# Patient Record
Sex: Male | Born: 1985 | Race: Black or African American | Hispanic: No | Marital: Single | Smoking: Never smoker
Health system: Southern US, Community
[De-identification: ages and names within clinical notes are randomized; demographics above are authoritative.]

## PROBLEM LIST (undated history)

## (undated) DIAGNOSIS — M419 Scoliosis, unspecified: Secondary | ICD-10-CM

---

## 2013-12-29 ENCOUNTER — Emergency Department (HOSPITAL_COMMUNITY): Payer: BC Managed Care – PPO

## 2013-12-29 ENCOUNTER — Inpatient Hospital Stay (HOSPITAL_COMMUNITY)
Admission: EM | Admit: 2013-12-29 | Discharge: 2014-01-06 | DRG: 956 | Disposition: A | Discharge status: Rehabilitation Facility | Payer: BC Managed Care – PPO | Attending: Surgery | Admitting: Surgery

## 2013-12-29 ENCOUNTER — Encounter (HOSPITAL_COMMUNITY): Payer: Self-pay | Admitting: Radiology

## 2013-12-29 DIAGNOSIS — S36113A Laceration of liver, unspecified degree, initial encounter: Secondary | ICD-10-CM | POA: Diagnosis present

## 2013-12-29 DIAGNOSIS — S7290XA Unspecified fracture of unspecified femur, initial encounter for closed fracture: Secondary | ICD-10-CM

## 2013-12-29 DIAGNOSIS — S27329A Contusion of lung, unspecified, initial encounter: Secondary | ICD-10-CM | POA: Diagnosis present

## 2013-12-29 DIAGNOSIS — I959 Hypotension, unspecified: Secondary | ICD-10-CM | POA: Diagnosis present

## 2013-12-29 DIAGNOSIS — S72413A Displaced unspecified condyle fracture of lower end of unspecified femur, initial encounter for closed fracture: Principal | ICD-10-CM | POA: Diagnosis present

## 2013-12-29 DIAGNOSIS — S83509A Sprain of unspecified cruciate ligament of unspecified knee, initial encounter: Secondary | ICD-10-CM | POA: Diagnosis present

## 2013-12-29 DIAGNOSIS — S0292XA Unspecified fracture of facial bones, initial encounter for closed fracture: Secondary | ICD-10-CM

## 2013-12-29 DIAGNOSIS — S82141A Displaced bicondylar fracture of right tibia, initial encounter for closed fracture: Secondary | ICD-10-CM

## 2013-12-29 DIAGNOSIS — D62 Acute posthemorrhagic anemia: Secondary | ICD-10-CM | POA: Diagnosis not present

## 2013-12-29 DIAGNOSIS — S82109A Unspecified fracture of upper end of unspecified tibia, initial encounter for closed fracture: Secondary | ICD-10-CM | POA: Diagnosis present

## 2013-12-29 DIAGNOSIS — S022XXA Fracture of nasal bones, initial encounter for closed fracture: Secondary | ICD-10-CM | POA: Diagnosis present

## 2013-12-29 DIAGNOSIS — S066X0A Traumatic subarachnoid hemorrhage without loss of consciousness, initial encounter: Secondary | ICD-10-CM

## 2013-12-29 DIAGNOSIS — S82001A Unspecified fracture of right patella, initial encounter for closed fracture: Secondary | ICD-10-CM

## 2013-12-29 DIAGNOSIS — S066X9A Traumatic subarachnoid hemorrhage with loss of consciousness of unspecified duration, initial encounter: Secondary | ICD-10-CM | POA: Diagnosis present

## 2013-12-29 DIAGNOSIS — S069X9A Unspecified intracranial injury with loss of consciousness of unspecified duration, initial encounter: Secondary | ICD-10-CM

## 2013-12-29 DIAGNOSIS — S82009A Unspecified fracture of unspecified patella, initial encounter for closed fracture: Secondary | ICD-10-CM | POA: Diagnosis present

## 2013-12-29 DIAGNOSIS — S83519A Sprain of anterior cruciate ligament of unspecified knee, initial encounter: Secondary | ICD-10-CM

## 2013-12-29 DIAGNOSIS — S72401A Unspecified fracture of lower end of right femur, initial encounter for closed fracture: Secondary | ICD-10-CM

## 2013-12-29 DIAGNOSIS — S53106A Unspecified dislocation of unspecified ulnohumeral joint, initial encounter: Secondary | ICD-10-CM | POA: Diagnosis present

## 2013-12-29 DIAGNOSIS — S069XAA Unspecified intracranial injury with loss of consciousness status unknown, initial encounter: Secondary | ICD-10-CM

## 2013-12-29 DIAGNOSIS — S62309A Unspecified fracture of unspecified metacarpal bone, initial encounter for closed fracture: Secondary | ICD-10-CM | POA: Diagnosis present

## 2013-12-29 DIAGNOSIS — S02109A Fracture of base of skull, unspecified side, initial encounter for closed fracture: Secondary | ICD-10-CM | POA: Diagnosis present

## 2013-12-29 DIAGNOSIS — S066XAA Traumatic subarachnoid hemorrhage with loss of consciousness status unknown, initial encounter: Secondary | ICD-10-CM | POA: Diagnosis present

## 2013-12-29 DIAGNOSIS — S82891A Other fracture of right lower leg, initial encounter for closed fracture: Secondary | ICD-10-CM

## 2013-12-29 DIAGNOSIS — S53105A Unspecified dislocation of left ulnohumeral joint, initial encounter: Secondary | ICD-10-CM

## 2013-12-29 DIAGNOSIS — S72412A Displaced unspecified condyle fracture of lower end of left femur, initial encounter for closed fracture: Secondary | ICD-10-CM

## 2013-12-29 LAB — COMPREHENSIVE METABOLIC PANEL
ALK PHOS: 70 U/L (ref 39–117)
ALT: 172 U/L — ABNORMAL HIGH (ref 0–53)
AST: 251 U/L — AB (ref 0–37)
Albumin: 4 g/dL (ref 3.5–5.2)
BILIRUBIN TOTAL: 0.4 mg/dL (ref 0.3–1.2)
BUN: 12 mg/dL (ref 6–23)
CHLORIDE: 99 meq/L (ref 96–112)
CO2: 22 meq/L (ref 19–32)
Calcium: 8.7 mg/dL (ref 8.4–10.5)
Creatinine, Ser: 1.11 mg/dL (ref 0.50–1.35)
GFR calc Af Amer: >90 mL/min (ref >90)
GFR calc non Af Amer: 90 mL/min — ABNORMAL LOW (ref >90)
Glucose, Bld: 154 mg/dL — ABNORMAL HIGH (ref 70–99)
Potassium: 3.6 mEq/L — ABNORMAL LOW (ref 3.7–5.3)
Sodium: 138 mEq/L (ref 137–147)
Total Protein: 7 g/dL (ref 6.0–8.3)

## 2013-12-29 LAB — CBC
HCT: 40.1 % (ref 39.0–52.0)
Hemoglobin: 13.9 g/dL (ref 13.0–17.0)
MCH: 29.3 pg (ref 26.0–34.0)
MCHC: 34.7 g/dL (ref 30.0–36.0)
MCV: 84.4 fL (ref 78.0–100.0)
PLATELETS: 283 10*3/uL (ref 150–400)
RBC: 4.75 MIL/uL (ref 4.22–5.81)
RDW: 12.8 % (ref 11.5–15.5)
WBC: 15.8 10*3/uL — ABNORMAL HIGH (ref 4.0–10.5)

## 2013-12-29 LAB — PREPARE FRESH FROZEN PLASMA
UNIT DIVISION: 0
Unit division: 0

## 2013-12-29 LAB — I-STAT CHEM 8, ED
BUN: 11 mg/dL (ref 6–23)
CALCIUM ION: 1.13 mmol/L (ref 1.12–1.23)
Chloride: 101 mEq/L (ref 96–112)
Creatinine, Ser: 1.2 mg/dL (ref 0.50–1.35)
Glucose, Bld: 149 mg/dL — ABNORMAL HIGH (ref 70–99)
HCT: 43 % (ref 39.0–52.0)
HEMOGLOBIN: 14.6 g/dL (ref 13.0–17.0)
Potassium: 3.2 mEq/L — ABNORMAL LOW (ref 3.7–5.3)
Sodium: 141 mEq/L (ref 137–147)
TCO2: 23 mmol/L (ref 0–100)

## 2013-12-29 LAB — ABO/RH: ABO/RH(D): O POS

## 2013-12-29 LAB — PROTIME-INR
INR: 1.07 (ref 0.00–1.49)
Prothrombin Time: 13.7 seconds (ref 11.6–15.2)

## 2013-12-29 LAB — ETHANOL

## 2013-12-29 LAB — I-STAT CG4 LACTIC ACID, ED: Lactic Acid, Venous: 2.82 mmol/L — ABNORMAL HIGH (ref 0.5–2.2)

## 2013-12-29 LAB — CDS SEROLOGY

## 2013-12-29 MED ORDER — FENTANYL CITRATE 0.05 MG/ML IJ SOLN
50.0000 ug | Freq: Once | INTRAMUSCULAR | Status: AC
  Administered 2013-12-29: 50 ug via INTRAVENOUS
  Filled 2013-12-29: qty 2

## 2013-12-29 MED ORDER — ETOMIDATE 2 MG/ML IV SOLN
INTRAVENOUS | Status: AC
  Administered 2013-12-29: 5 mg via INTRAVENOUS
  Filled 2013-12-29: qty 10

## 2013-12-29 MED ORDER — IOHEXOL 300 MG/ML  SOLN
100.0000 mL | Freq: Once | INTRAMUSCULAR | Status: AC | PRN
  Administered 2013-12-29: 100 mL via INTRAVENOUS

## 2013-12-29 MED ORDER — ETOMIDATE 2 MG/ML IV SOLN
5.0000 mg | Freq: Once | INTRAVENOUS | Status: AC
Start: 2013-12-29 — End: 2013-12-29
  Administered 2013-12-29: 5 mg via INTRAVENOUS

## 2013-12-29 MED ORDER — HYDROMORPHONE HCL PF 1 MG/ML IJ SOLN: 1.0000 mg | Freq: Once | INTRAMUSCULAR | Status: DC

## 2013-12-29 MED ORDER — ONDANSETRON HCL 4 MG/2ML IJ SOLN
4.0000 mg | Freq: Once | INTRAMUSCULAR | Status: AC
  Administered 2013-12-29: 4 mg via INTRAVENOUS
  Filled 2013-12-29: qty 2

## 2013-12-29 NOTE — H&P (Signed)
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 maxillary sinus. The remaining paranasal sinuses and mastoid air cells are well-aerated. Soft tissue swelling is noted overlying the right maxilla, with associated soft tissue laceration along the right cheek.  CT MAXILLOFACIAL FINDINGS  There is a minimally displaced fracture involving both sides of the nasal bone. There is also a small comminuted fracture involving the anterior nasal spine, with associated soft tissue swelling and scattered soft tissue air.  The mandible appears intact. A large dental caries is noted at the right third maxillary molar.  The orbits are intact bilaterally. A small amount of blood is noted within the right maxillary sinus; this appears to reflect disruption of the medial wall of the maxillary sinus. The remaining visualized paranasal sinuses and mastoid air cells are well-aerated.  Soft tissue swelling is noted along the right maxilla, with associated soft tissue laceration along the right cheek. The parapharyngeal fat planes are preserved. The nasopharynx, oropharynx and hypopharynx are unremarkable in appearance. The visualized portions of the valleculae and piriform sinuses are grossly unremarkable.  The parotid and submandibular glands are  within normal limits. No cervical lymphadenopathy is seen.  CT CERVICAL SPINE FINDINGS  There is no evidence of fracture or subluxation. Mild reversal of the normal lordotic curvature of the cervical spine is thought to be positional in nature. Vertebral bodies demonstrate normal height and alignment. Intervertebral disc spaces are preserved. Prevertebral soft tissues are within normal limits. The visualized neural foramina are grossly unremarkable.  The thyroid gland is unremarkable in appearance. The visualized lung apices are clear. No significant soft tissue abnormalities are seen.  IMPRESSION: 1. Small amount of acute subarachnoid hemorrhage noted on the right side near the vertex, with partial effacement of the underlying sulci. 2. No additional evidence for traumatic intracranial injury. 3. Minimally displaced fracture involving both sides of the nasal bone. 4. Small comminuted fracture involving the anterior nasal spine, with associated soft swelling and scattered soft tissue air. 5. Soft tissue swelling overlying the right maxilla, with associated soft tissue laceration along the right cheek. 6. Small amount of blood within the right maxillary sinus appears to reflect disruption of the medial wall of the right maxillary sinus. 7. Large dental caries noted at the right third maxillary molar. 8. No evidence of fracture or subluxation along the cervical spine.  Critical Value/emergent results were called by telephone at the time of interpretation on 12/29/2013 at 10:52 PM to Dr. Brantley Stage, who verbally acknowledged these results.   Electronically Signed   By: Garald Balding M.D.   On: 12/29/2013 23:02    Review of Systems  Unable to perform ROS   Blood pressure 97/51, pulse 73, temperature 98 F (36.7 C), temperature source Oral, resp. rate 17, height _0  (1.727 m), weight 150 lb (68.04 kg), SpO2 100.00%. Physical Exam  Constitutional: He is oriented to person, place, and time. He appears  well-developed. No distress.  HENT:  Head: Head is with abrasion, with contusion and with laceration.    Nose: Sinus tenderness present.  Cardiovascular: Normal rate and regular rhythm.   Respiratory: Effort normal and breath sounds normal. He exhibits tenderness.  GI: Soft. There is tenderness. There is no rigidity and no guarding.    Genitourinary: Rectum normal and penis normal.  Musculoskeletal:       Left elbow: He exhibits deformity.  Right knee: He exhibits deformity. Tenderness found.  Neurological: He is alert and oriented to person, place, and time. GCS eye subscore is 4. GCS verbal subscore is 5. GCS motor subscore is 6.  Skin: Skin is warm and dry.  Psychiatric: He has a normal mood and affect. His behavior is normal. Judgment and thought content normal.     Assessment/Plan [facility] SMALL SAH   DR Arnoldo Morale LEFT ELBOW DISLOCATION/ RIGHT DISTAL FEMUR FRACTURE /POSSIBLE TORN ACL PER ORTHO DUDA Small liver laceration  Observe follow H/H Right pulmonary contusion  NASAL FRACTURE NO ACUTE TREATMENT TONIGHT SMALL FACIAL LACERATION TOO SMALL TO CLOSE LOCAL WOUND CARE.  ADMIT TRAUMA /ICU  KEEP IN C COLLAR IN AM TO CLEAR     CORNETT,THOMAS A. 12/29/2013, 11:21 PM   Procedures

## 2013-12-29 NOTE — Progress Notes (Signed)
Orthopedic Tech Progress Note Patient Details:  Alexander CuriaSinclair Liou 07/20/1985 [id number]  Ortho Devices Type of Ortho Device: Sling immobilizer Ortho Device/Splint Location: sling applied to the left upper extremity.  Ortho Device/Splint Interventions: Application   Early CharsBaker,William Anthony 12/29/2013, 11:46 PM
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
## 2013-12-29 NOTE — ED Notes (Signed)
Back to Trauma Room B, no changes.
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
## 2013-12-29 NOTE — ED Notes (Signed)
All belongings (clothes, boots, wallet, $20 bill, protective vest) given to Barbara CowerJason (brother), clothes cut PTA. [place] 9mm given to [employer].

## 2013-12-29 NOTE — ED Notes (Signed)
[facility]

## 2013-12-29 NOTE — ED Notes (Signed)
[facility], warm blankets given, VSS, alert, NAD, calm, interactive, to CT.
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
## 2013-12-29 NOTE — ED Notes (Addendum)
Head, neck, chest, abd & pelvis complete, VSS, no changes, preparing to scan R knee.
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
## 2013-12-29 NOTE — ED Notes (Signed)
BS FAST + for "some fluid".

## 2013-12-29 NOTE — ED Notes (Signed)
CT finished, no changes.

## 2013-12-29 NOTE — ED Notes (Signed)
[facility]

## 2013-12-29 NOTE — ED Notes (Signed)
[facility] contact information left for pt.

## 2013-12-29 NOTE — ED Notes (Signed)
CG-4 results reported to Dr. Rubin PayorPickering
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
## 2013-12-29 NOTE — ED Notes (Signed)
Into CT 3

## 2013-12-29 NOTE — ED Notes (Signed)
[facility], pt arousable and cooperative, NAD, calm, resting. Thayer OhmChris [facility]

## 2013-12-29 NOTE — ED Notes (Signed)
[facility] Secretary/administratorChris at [facility] Updated. pending admission orders & inpt bed assignment. Pt sedated. Sleeping. arousable to voice.remains on O2 [place] since etomidate sedation. No changes, VSS.
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
## 2013-12-29 NOTE — ED Notes (Signed)
Scanning begins. No changes, VSS, alert, NAD, calm, 103/62, HR 72, SPO2 100% RA.

## 2013-12-30 ENCOUNTER — Encounter (HOSPITAL_COMMUNITY): Admission: EM | Disposition: A | Discharge status: Rehabilitation Facility | Payer: Self-pay | Source: Home / Self Care

## 2013-12-30 ENCOUNTER — Inpatient Hospital Stay (HOSPITAL_COMMUNITY): Payer: BC Managed Care – PPO

## 2013-12-30 DIAGNOSIS — S02109A Fracture of base of skull, unspecified side, initial encounter for closed fracture: Secondary | ICD-10-CM

## 2013-12-30 LAB — COMPREHENSIVE METABOLIC PANEL
ALBUMIN: 3 g/dL — AB (ref 3.5–5.2)
ALK PHOS: 54 U/L (ref 39–117)
ALT: 144 U/L — ABNORMAL HIGH (ref 0–53)
AST: 176 U/L — ABNORMAL HIGH (ref 0–37)
BILIRUBIN TOTAL: 0.5 mg/dL (ref 0.3–1.2)
BUN: 12 mg/dL (ref 6–23)
CHLORIDE: 109 meq/L (ref 96–112)
CO2: 22 meq/L (ref 19–32)
CREATININE: 0.95 mg/dL (ref 0.50–1.35)
Calcium: 7.3 mg/dL — ABNORMAL LOW (ref 8.4–10.5)
GFR calc Af Amer: >90 mL/min (ref >90)
GLUCOSE: 112 mg/dL — AB (ref 70–99)
Potassium: 3.9 mEq/L (ref 3.7–5.3)
Sodium: 142 mEq/L (ref 137–147)
Total Protein: 5.1 g/dL — ABNORMAL LOW (ref 6.0–8.3)

## 2013-12-30 LAB — CBC
HCT: 31.1 % — ABNORMAL LOW (ref 39.0–52.0)
HCT: 27.1 % — ABNORMAL LOW (ref 39.0–52.0)
HEMOGLOBIN: 9.2 g/dL — AB (ref 13.0–17.0)
Hemoglobin: 10.6 g/dL — ABNORMAL LOW (ref 13.0–17.0)
MCH: 29.4 pg (ref 26.0–34.0)
MCH: 28.8 pg (ref 26.0–34.0)
MCHC: 34.1 g/dL (ref 30.0–36.0)
MCHC: 33.9 g/dL (ref 30.0–36.0)
MCV: 86.4 fL (ref 78.0–100.0)
MCV: 84.7 fL (ref 78.0–100.0)
Platelets: 171 10*3/uL (ref 150–400)
Platelets: 178 10*3/uL (ref 150–400)
RBC: 3.6 MIL/uL — ABNORMAL LOW (ref 4.22–5.81)
RBC: 3.2 MIL/uL — AB (ref 4.22–5.81)
RDW: 13 % (ref 11.5–15.5)
RDW: 13 % (ref 11.5–15.5)
WBC: 11.7 10*3/uL — ABNORMAL HIGH (ref 4.0–10.5)
WBC: 8.3 10*3/uL (ref 4.0–10.5)

## 2013-12-30 LAB — TYPE AND SCREEN
ABO/RH(D): O POS
Antibody Screen: NEGATIVE
UNIT DIVISION: 0
Unit division: 0

## 2013-12-30 LAB — MRSA PCR SCREENING: MRSA BY PCR: NEGATIVE

## 2013-12-30 SURGERY — OPEN REDUCTION INTERNAL FIXATION (ORIF) DISTAL FEMUR FRACTURE
Anesthesia: General | Laterality: Right

## 2013-12-30 MED ORDER — NALOXONE HCL 0.4 MG/ML IJ SOLN: 0.4000 mg | INTRAMUSCULAR | Status: DC | PRN

## 2013-12-30 MED ORDER — DIPHENHYDRAMINE HCL 50 MG/ML IJ SOLN: 12.5000 mg | Freq: Four times a day (QID) | INTRAMUSCULAR | Status: DC | PRN

## 2013-12-30 MED ORDER — ONDANSETRON HCL 4 MG/2ML IJ SOLN: 4.0000 mg | Freq: Four times a day (QID) | INTRAMUSCULAR | Status: DC | PRN

## 2013-12-30 MED ORDER — ALBUMIN HUMAN 5 % IV SOLN
12.5000 g | Freq: Once | INTRAVENOUS | Status: AC
  Administered 2013-12-30: 12.5 g via INTRAVENOUS
  Filled 2013-12-30: qty 250

## 2013-12-30 MED ORDER — KCL IN DEXTROSE-NACL 20-5-0.9 MEQ/L-%-% IV SOLN
INTRAVENOUS | Status: DC
  Administered 2013-12-30 – 2013-12-31 (×4): via INTRAVENOUS
  Filled 2013-12-30 (×9): qty 1000

## 2013-12-30 MED ORDER — HYDROMORPHONE 0.3 MG/ML IV SOLN
INTRAVENOUS | Status: DC
  Administered 2013-12-30: 02:00:00 via INTRAVENOUS
  Filled 2013-12-30: qty 25

## 2013-12-30 MED ORDER — HYDROMORPHONE 0.3 MG/ML IV SOLN
INTRAVENOUS | Status: DC
Start: 2013-12-30 — End: 2013-12-30

## 2013-12-30 MED ORDER — DIPHENHYDRAMINE HCL 12.5 MG/5ML PO ELIX
12.5000 mg | ORAL_SOLUTION | Freq: Four times a day (QID) | ORAL | Status: DC | PRN
  Filled 2013-12-30: qty 5

## 2013-12-30 MED ORDER — HYDROMORPHONE HCL PF 1 MG/ML IJ SOLN
0.5000 mg | INTRAMUSCULAR | Status: DC | PRN
  Administered 2013-12-30 – 2013-12-31 (×5): 1 mg via INTRAVENOUS
  Administered 2013-12-31: 0.5 mg via INTRAVENOUS
  Administered 2013-12-31: 1 mg via INTRAVENOUS
  Administered 2014-01-01: 0.5 mg via INTRAVENOUS
  Filled 2013-12-30 (×8): qty 1

## 2013-12-30 MED ORDER — CLINDAMYCIN PHOSPHATE 900 MG/50ML IV SOLN
900.0000 mg | INTRAVENOUS | Status: AC
Start: 2013-12-30 — End: 2013-12-31
  Filled 2013-12-30: qty 50

## 2013-12-30 MED ORDER — WHITE PETROLATUM GEL
Status: AC
  Filled 2013-12-30: qty 5

## 2013-12-30 MED ORDER — OXYCODONE HCL 5 MG PO TABS
5.0000 mg | ORAL_TABLET | ORAL | Status: DC | PRN
  Administered 2013-12-31: 10 mg via ORAL
  Administered 2013-12-31 (×2): 5 mg via ORAL
  Administered 2014-01-01 – 2014-01-02 (×5): 10 mg via ORAL
  Filled 2013-12-30: qty 1
  Filled 2013-12-30 (×2): qty 2
  Filled 2013-12-30: qty 1
  Filled 2013-12-30 (×4): qty 2

## 2013-12-30 MED ORDER — CHLORHEXIDINE GLUCONATE 4 % EX LIQD
60.0000 mL | Freq: Once | CUTANEOUS | Status: DC
  Filled 2013-12-30: qty 60

## 2013-12-30 MED ORDER — SODIUM CHLORIDE 0.9 % IJ SOLN: 9.0000 mL | INTRAMUSCULAR | Status: DC | PRN

## 2013-12-30 MED ORDER — ONDANSETRON HCL 4 MG PO TABS: 4.0000 mg | ORAL_TABLET | Freq: Four times a day (QID) | ORAL | Status: DC | PRN

## 2013-12-30 MED ORDER — BIOTENE DRY MOUTH MT LIQD
15.0000 mL | Freq: Two times a day (BID) | OROMUCOSAL | Status: DC
  Administered 2013-12-30 (×2): 15 mL via OROMUCOSAL

## 2013-12-30 MED ORDER — ONDANSETRON HCL 4 MG/2ML IJ SOLN
4.0000 mg | Freq: Four times a day (QID) | INTRAMUSCULAR | Status: DC | PRN
  Filled 2013-12-30: qty 2

## 2013-12-30 MED ORDER — ONDANSETRON HCL 4 MG/2ML IJ SOLN
4.0000 mg | Freq: Four times a day (QID) | INTRAMUSCULAR | Status: DC | PRN
  Administered 2013-12-30: 4 mg via INTRAVENOUS

## 2013-12-30 SURGICAL SUPPLY — 49 items
BANDAGE GAUZE ELAST BULKY 4 IN (GAUZE/BANDAGES/DRESSINGS) ×3 IMPLANT
BLADE SURG 10 STRL SS (BLADE) ×3 IMPLANT
BLADE SURG ROTATE 9660 (MISCELLANEOUS) IMPLANT
BNDG COHESIVE 6X5 TAN STRL LF (GAUZE/BANDAGES/DRESSINGS) ×6 IMPLANT
CLEANER TIP ELECTROSURG 2X2 (MISCELLANEOUS) ×3 IMPLANT
COVER MAYO STAND STRL (DRAPES) ×3 IMPLANT
COVER SURGICAL LIGHT HANDLE (MISCELLANEOUS) ×3 IMPLANT
CUFF TOURNIQUET SINGLE 34IN LL (TOURNIQUET CUFF) IMPLANT
CUFF TOURNIQUET SINGLE 44IN (TOURNIQUET CUFF) IMPLANT
DRAPE C-ARM 42X72 X-RAY (DRAPES) IMPLANT
DRAPE INCISE IOBAN 66X45 STRL (DRAPES) ×3 IMPLANT
DRAPE ORTHO SPLIT 77X108 STRL (DRAPES) ×2
DRAPE SURG ORHT 6 SPLT 77X108 (DRAPES) ×1 IMPLANT
DRAPE U-SHAPE 47X51 STRL (DRAPES) ×3 IMPLANT
DRSG ADAPTIC 3X8 NADH LF (GAUZE/BANDAGES/DRESSINGS) ×3 IMPLANT
DRSG PAD ABDOMINAL 8X10 ST (GAUZE/BANDAGES/DRESSINGS) ×6 IMPLANT
DURAPREP 26ML APPLICATOR (WOUND CARE) ×3 IMPLANT
ELECT REM PT RETURN 9FT ADLT (ELECTROSURGICAL) ×3
ELECTRODE REM PT RTRN 9FT ADLT (ELECTROSURGICAL) ×1 IMPLANT
EVACUATOR 1/8 PVC DRAIN (DRAIN) IMPLANT
GLOVE BIOGEL PI IND STRL 9 (GLOVE) ×1 IMPLANT
GLOVE BIOGEL PI INDICATOR 9 (GLOVE) ×2
GLOVE SURG ORTHO 9.0 STRL STRW (GLOVE) ×3 IMPLANT
GOWN STRL REUS W/ TWL XL LVL3 (GOWN DISPOSABLE) ×3 IMPLANT
GOWN STRL REUS W/TWL XL LVL3 (GOWN DISPOSABLE) ×6
KIT BASIN OR (CUSTOM PROCEDURE TRAY) ×3 IMPLANT
KIT ROOM TURNOVER OR (KITS) ×3 IMPLANT
MANIFOLD NEPTUNE II (INSTRUMENTS) ×3 IMPLANT
NEEDLE 22X1 1/2 (OR ONLY) (NEEDLE) ×3 IMPLANT
NS IRRIG 1000ML POUR BTL (IV SOLUTION) ×3 IMPLANT
PACK ORTHO EXTREMITY (CUSTOM PROCEDURE TRAY) ×3 IMPLANT
PAD ARMBOARD 7.5X6 YLW CONV (MISCELLANEOUS) ×6 IMPLANT
SPONGE GAUZE 4X4 12PLY (GAUZE/BANDAGES/DRESSINGS) ×6 IMPLANT
SPONGE LAP 18X18 X RAY DECT (DISPOSABLE) ×3 IMPLANT
STAPLER VISISTAT 35W (STAPLE) IMPLANT
STOCKINETTE IMPERVIOUS LG (DRAPES) ×3 IMPLANT
SUCTION FRAZIER TIP 10 FR DISP (SUCTIONS) ×3 IMPLANT
SUT ETHILON 2 0 PSLX (SUTURE) IMPLANT
SUT ETHILON 4 0 PS 2 18 (SUTURE) IMPLANT
SUT VIC AB 0 CTB1 27 (SUTURE) ×3 IMPLANT
SUT VIC AB 1 CTB1 27 (SUTURE) ×3 IMPLANT
SUT VIC AB 2-0 CTB1 (SUTURE) ×3 IMPLANT
SYR 20ML ECCENTRIC (SYRINGE) ×3 IMPLANT
TOWEL OR 17X24 6PK STRL BLUE (TOWEL DISPOSABLE) ×3 IMPLANT
TOWEL OR 17X26 10 PK STRL BLUE (TOWEL DISPOSABLE) ×3 IMPLANT
TUBE CONNECTING 12'X1/4 (SUCTIONS) ×1
TUBE CONNECTING 12X1/4 (SUCTIONS) ×2 IMPLANT
WATER STERILE IRR 1000ML POUR (IV SOLUTION) ×6 IMPLANT
YANKAUER SUCT BULB TIP NO VENT (SUCTIONS) ×3 IMPLANT
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
## 2013-12-30 NOTE — Progress Notes (Signed)
SLP Cancellation Note  Patient Details Name: [name] MRN: [id number] DOB: 1986-05-13   Cancelled treatment:       Reason Eval/Treat Not Completed: Patient at procedure or test/unavailable. Pt being transported to MRI. Will return as able to complete cognitive-linguistic evaluation.    Maxcine HamLaura Paiewonsky, M.A. CCC-SLP [phone]  Maxcine Hamaiewonsky, Laura 12/30/2013, 2:49 PM

## 2013-12-30 NOTE — Progress Notes (Signed)
Patient ID: [name], male   DOB: [birth year], 28 y.o.   MRN: [id number] R 2,4,5 MC FX. I consulted Dr. Izora Ribasoley. Violeta GelinasBurke Thompson, MD, MPH, FACS Trauma: [phone] General Surgery: [phone]

## 2013-12-30 NOTE — Progress Notes (Addendum)
Day of Surgery  Subjective: Some L chest pain, R knee pain, L elbow pain  Objective: Vital signs in last 24 hours: Temp:  [97.9 F (36.6 C)-99.7 F (37.6 C)] 98.9 F (37.2 C) (06/19 0747) Pulse Rate:  [67-97] 80 (06/19 0700) Resp:  [12-21] 15 (06/19 0747) BP: (84-165)/(47-121) 93/53 mmHg (06/19 0700) SpO2:  [98 %-100 %] 99 % (06/19 0747) Weight:  [150 lb (68.04 kg)-162 lb 14.7 oz (73.9 kg)] 162 lb 14.7 oz (73.9 kg) (06/19 0100)    Intake/Output from previous day: 06/18 0701 - 06/19 0700 In: 3528.3 [I.V.:3528.3] Out: 1050 [Urine:1050] Intake/Output this shift:    General appearance: cooperative Resp: clear to auscultation bilaterally Chest wall: left sided chest wall tenderness Cardio: regular rate and rhythm GI: large abrasion R abdomen, mild tenderness RUQ Extremities: L elbow in sling, R knee immobilizer, contusion dorsum R foot Neuro: GCS 14 [id number], F/C well and speech fluent  Lab Results: CBC   Recent Labs  12/29/13 2130 12/29/13 2138 12/30/13 0335  WBC 15.8*  --  11.7*  HGB 13.9 14.6 10.6*  HCT 40.1 43.0 31.1*  PLT 283  --  171   BMET  Recent Labs  12/29/13 2130 12/29/13 2138 12/30/13 0335  NA 138 141 142  K 3.6* 3.2* 3.9  CL 99 101 109  CO2 22  --  22  GLUCOSE 154* 149* 112*  BUN 12 11 12   CREATININE 1.11 1.20 0.95  CALCIUM 8.7  --  7.3*   PT/INR   Anti-infectives: Anti-infectives   Start     Dose/Rate Route Frequency Ordered Stop   12/30/13 0600  clindamycin (CLEOCIN) IVPB 900 mg     900 mg 100 mL/hr over 30 Minutes Intravenous On call to O.R. 12/30/13 0047 12/31/13 0559      [facility] Dr. Lovell SheehanJenkins to see, TBI team therapies Nasal FX, R maxillary sinus FX - Dr. Pollyann Kennedyosen to see L elbow dislocation, R distal femur and patella FX/tibial plateau FX - elbow relocated by Dr. Lajoyce Cornersuda, Dr. Roda ShuttersXu has ordered MR knee. Likely surgery 6/22 Pulmonary contusion - pulmonary toilet Grade 1 liver lac, abdominal wall abrasion - seriel Hb,  allow UOB CV - soft BP, albumin bolus FEN - not using PCA, will D/C. Clears Dispo - ICU I spoke with his mother at the bedside  LOS: 1 day    Violeta GelinasBurke Thompson, MD, MPH, FACS Trauma: [phone] General Surgery: [phone]  12/30/2013

## 2013-12-30 NOTE — Consult Note (Signed)
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 nasal bone fracture is better characterized on concurrent maxillofacial images. The visualized portions of the orbits are within normal limits. A small amount of blood is seen within the right maxillary sinus. The remaining paranasal sinuses and mastoid air cells are well-aerated. Soft tissue swelling is noted overlying the right maxilla, with associated soft tissue laceration along the right cheek.  CT MAXILLOFACIAL FINDINGS  There is a minimally displaced fracture involving both sides of the nasal bone. There is also a small comminuted fracture involving the anterior nasal spine, with associated soft tissue swelling and scattered soft tissue air.  The mandible appears intact. A large dental caries is noted at the right third maxillary molar.  The orbits are intact bilaterally. A small amount of blood is noted within the right maxillary sinus; this appears to reflect disruption of the medial wall of the maxillary sinus. The remaining visualized paranasal sinuses and mastoid air cells are well-aerated.  Soft tissue swelling is noted along the right maxilla, with associated soft tissue laceration along the right cheek. The parapharyngeal fat planes are preserved. The nasopharynx, oropharynx and hypopharynx are unremarkable in appearance. The visualized portions of the valleculae and  piriform sinuses are grossly unremarkable.  The parotid and submandibular glands are within normal limits. No cervical lymphadenopathy is seen.  CT CERVICAL SPINE FINDINGS  There is no evidence of fracture or subluxation. Mild reversal of the normal lordotic curvature of the cervical spine is thought to be positional in nature. Vertebral bodies demonstrate normal height and alignment. Intervertebral disc spaces are preserved. Prevertebral soft tissues are within normal limits. The visualized neural foramina are grossly unremarkable.  The thyroid gland is unremarkable in appearance. The visualized lung apices are clear. No significant soft tissue abnormalities are seen.  IMPRESSION: 1. Small amount of acute subarachnoid hemorrhage noted on the right side near the vertex, with partial effacement of the underlying sulci. 2. No additional evidence for traumatic intracranial injury. 3. Minimally displaced fracture involving both sides of the nasal bone. 4. Small comminuted fracture involving the anterior nasal spine, with associated soft swelling and scattered soft tissue air. 5. Soft tissue swelling overlying the right maxilla, with associated soft tissue laceration along the right cheek. 6. Small amount of blood within the right maxillary sinus appears to reflect disruption of the medial wall of the right maxillary sinus. 7. Large dental caries noted at the  right third maxillary molar. 8. No evidence of fracture or subluxation along the cervical spine.  Critical Value/emergent results were called by telephone at the time of interpretation on 12/29/2013 at 10:52 PM to Dr. Luisa Hart, who verbally acknowledged these results.   Electronically Signed   By: Roanna Raider M.D.   On: 12/29/2013 23:02    Positive ROS: All other systems have been reviewed and were otherwise negative with the exception of those mentioned in the HPI and as above.  Physical Exam: General: Alert, no acute distress Cardiovascular: No pedal  edema Respiratory: No cyanosis, no use of accessory musculature GI: No organomegaly, abdomen is soft and non-tender Skin: No lesions in the area of chief complaint Neurologic: Sensation intact distally Psychiatric: Patient is competent for consent with normal mood and affect Lymphatic: No axillary or cervical lymphadenopathy  MUSCULOSKELETAL:  - superficial abrasions directly over patella, no exposed bone - painful ROM of knee - grade 3 lachman without stable endpoint - grade 3 valgus stress without stable endpoint - grade 3 varus stress without stable endpoint - 2+ pulses distally  Assessment: Right medial femoral condyle fracture, lateral tibial plateau segond fracture, likely multiligamentous knee injury  Plan: - will need MRI of right knee to fully evaluate severity of injury - discussed this with patient at bedside - local wound care to the superficial abrasions over patella - KI at all times - will need surgery for medial femoral condyle fracture  Thank you for the consult and the opportunity to see [name]. Glee Arvin, MD [facility] [phone] 7:11 AM

## 2013-12-31 DIAGNOSIS — S066XAA Traumatic subarachnoid hemorrhage with loss of consciousness status unknown, initial encounter: Secondary | ICD-10-CM

## 2013-12-31 DIAGNOSIS — S066X9A Traumatic subarachnoid hemorrhage with loss of consciousness of unspecified duration, initial encounter: Secondary | ICD-10-CM

## 2013-12-31 LAB — CBC
HCT: 25.4 % — ABNORMAL LOW (ref 39.0–52.0)
HEMATOCRIT: 26.7 % — AB (ref 39.0–52.0)
HEMOGLOBIN: 9 g/dL — AB (ref 13.0–17.0)
Hemoglobin: 8.6 g/dL — ABNORMAL LOW (ref 13.0–17.0)
MCH: 28.8 pg (ref 26.0–34.0)
MCH: 28.9 pg (ref 26.0–34.0)
MCHC: 33.9 g/dL (ref 30.0–36.0)
MCHC: 33.7 g/dL (ref 30.0–36.0)
MCV: 84.9 fL (ref 78.0–100.0)
MCV: 85.9 fL (ref 78.0–100.0)
Platelets: 155 10*3/uL (ref 150–400)
Platelets: 144 10*3/uL — ABNORMAL LOW (ref 150–400)
RBC: 2.99 MIL/uL — AB (ref 4.22–5.81)
RBC: 3.11 MIL/uL — AB (ref 4.22–5.81)
RDW: 13.1 % (ref 11.5–15.5)
RDW: 13 % (ref 11.5–15.5)
WBC: 7.5 10*3/uL (ref 4.0–10.5)
WBC: 9.4 10*3/uL (ref 4.0–10.5)
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
## 2013-12-31 NOTE — Progress Notes (Signed)
I saw the patient, participated in the history, exam and medical decision making, and concur with the physician assistant's note above.  Asleep easily awakens Asks for gingerale B/l UE splint or sling, NVI MAE Soft, nd.  Appropriate  Keep in ICU  Repeat cbc in am If stable, consider tx to SDU  Mary SellaEric M. Andrey CampanileWilson, MD, FACS General, Bariatric, & Minimally Invasive Surgery [facility] [place]

## 2013-12-31 NOTE — Progress Notes (Signed)
OT Cancellation Note  Patient Details Name: [name] MRN: [id number] DOB: 1985-12-14   Cancelled Treatment:    Reason Eval/Treat Not Completed: Other (comment) TBI team ordered. Pt is a & o per RN (clearly is not a Rancho Level IV or below). Noted SLP has an order for cognitive/linguistic evaluation. He currently is awaiting orthopedic surgery to RLE (?6/22). Will defer cognitive eval to SLP and any cognitive needs to be addressed by OT can be initiated once he has had surgery and is allowed to mobilize with OT.   Please re-order OT after RLE surgery.   Evette GeorgesLeonard, Catherine Eva [phone] 12/31/2013, 4:58 PM
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
## 2013-12-31 NOTE — Progress Notes (Signed)
Pt stable and awake Left arm elbow reduced - epl fpl io 5/5 rad 2/4 Right foot df pf ok dp 2/4 Or for right knee next week Left elbow rom ok 90 degrees to full flexion

## 2013-12-31 NOTE — Progress Notes (Signed)
Orthopedic Tech Progress Note Patient Details:  [name] August 03, 1985 [id number] Long volar extending the length of fingers applied; tolerated well Ortho Devices Type of Ortho Device: Ace wrap;Volar splint Ortho Device/Splint Location: RUE Ortho Device/Splint Interventions: Application   Asia R Thompson 12/31/2013, 1:08 PM

## 2013-12-31 NOTE — Progress Notes (Signed)
PT Cancellation Note  Patient Details Name: [name] MRN: [id number] DOB: [birth year]   Cancelled Treatment:    Reason Eval Not Completed: TBI team ordered. Pt is a & o per RN (clearly is not a Rancho Level IV or below). Noted SLP has an order for cognitive/linguistic evaluation. He currently is awaiting orthopedic surgery to RLE (?6/22). Will defer cognitive eval to SLP and any cognitive needs to be addressed by PT can be initiated once he has had surgery and is allowed to mobilize with PT.   Please re-order PT after RLE surgery.   Arnett Duddy 12/31/2013, 10:54 AM Pager [phone]

## 2014-01-01 LAB — BASIC METABOLIC PANEL
BUN: 3 mg/dL — AB (ref 6–23)
CALCIUM: 8 mg/dL — AB (ref 8.4–10.5)
CO2: 26 meq/L (ref 19–32)
CREATININE: 0.75 mg/dL (ref 0.50–1.35)
Chloride: 100 mEq/L (ref 96–112)
GFR calc Af Amer: >90 mL/min (ref >90)
GFR calc non Af Amer: >90 mL/min (ref >90)
Glucose, Bld: 129 mg/dL — ABNORMAL HIGH (ref 70–99)
Potassium: 4.4 mEq/L (ref 3.7–5.3)
Sodium: 136 mEq/L — ABNORMAL LOW (ref 137–147)

## 2014-01-01 LAB — CBC
HCT: 24.1 % — ABNORMAL LOW (ref 39.0–52.0)
Hemoglobin: 8.2 g/dL — ABNORMAL LOW (ref 13.0–17.0)
MCH: 28.6 pg (ref 26.0–34.0)
MCHC: 34 g/dL (ref 30.0–36.0)
MCV: 84 fL (ref 78.0–100.0)
Platelets: 139 10*3/uL — ABNORMAL LOW (ref 150–400)
RBC: 2.87 MIL/uL — AB (ref 4.22–5.81)
RDW: 12.7 % (ref 11.5–15.5)
WBC: 8.9 10*3/uL (ref 4.0–10.5)

## 2014-01-01 LAB — SURGICAL PCR SCREEN
MRSA, PCR: NEGATIVE
Staphylococcus aureus: NEGATIVE

## 2014-01-01 MED ORDER — SODIUM CHLORIDE 0.9 % IV SOLN
INTRAVENOUS | Status: DC
  Administered 2014-01-01 – 2014-01-02 (×2): via INTRAVENOUS

## 2014-01-01 MED ORDER — CLINDAMYCIN PHOSPHATE 900 MG/50ML IV SOLN
900.0000 mg | INTRAVENOUS | Status: AC
  Administered 2014-01-02: 900 mg via INTRAVENOUS
  Filled 2014-01-01: qty 50

## 2014-01-01 MED ORDER — CHLORHEXIDINE GLUCONATE 4 % EX LIQD
60.0000 mL | Freq: Once | CUTANEOUS | Status: DC
  Filled 2014-01-01: qty 60

## 2014-01-01 NOTE — Progress Notes (Signed)
SLP Cancellation Note  Patient Details Name: [name] MRN: [id number] DOB: 1985-08-31   Cancelled treatment: ST received order for Cognitive Linguistic Evaluation.  Evaluation deferred to 01/02/14 Moreen FowlerKaren Dankof MS, CCC-SLP [phone] [facility] TylerDANKOF,KAREN 01/01/2014, 12:50 PM

## 2014-01-01 NOTE — Progress Notes (Addendum)
Patient ID: Alexander CuriaSinclair Prentiss, male   DOB: 1986-04-13, 28 y.o.   MRN: [id number] 2 Days Post-Op  Subjective: Better, wants to stay on clears, passing flatus  Objective: Vital signs in last 24 hours: Temp:  [98.1 F (36.7 C)-100.4 F (38 C)] 98.1 F (36.7 C) (06/21 0728) Pulse Rate:  [88-104] 96 (06/21 0900) Resp:  [10-24] 16 (06/21 0900) BP: (110-128)/(46-60) 115/59 mmHg (06/21 0900) SpO2:  [90 %-100 %] 99 % (06/21 0900)    Intake/Output from previous day: 06/20 0701 - 06/21 0700 In: 2400 [I.V.:2400] Out: 2270 [Urine:2270] Intake/Output this shift: Total I/O In: 200 [I.V.:200] Out: 350 [Urine:350]  General appearance: alert and cooperative Resp: clear to auscultation bilaterally Cardio: regular rate and rhythm GI: soft, mild dist, +BS, NT Extremities: splint RUE, KI RLE toes warm  Lab Results: CBC   Recent Labs  12/31/13 1410 01/01/14 0219  WBC 9.4 8.9  HGB 9.0* 8.2*  HCT 26.7* 24.1*  PLT 144* 139*   BMET  Recent Labs  12/30/13 0335 01/01/14 0219  NA 142 136*  K 3.9 4.4  CL 109 100  CO2 22 26  GLUCOSE 112* 129*  BUN 12 3*  CREATININE 0.95 0.75  CALCIUM 7.3* 8.0*   PT/INR  Recent Labs  12/29/13 2130  LABPROT 13.7  INR 1.07    Anti-infectives: Anti-infectives   Start     Dose/Rate Route Frequency Ordered Stop   12/30/13 0600  clindamycin (CLEOCIN) IVPB 900 mg     900 mg 100 mL/hr over 30 Minutes Intravenous On call to O.R. 12/30/13 0047 12/31/13 0559      [facility]  TBI/SAH - Dr. Lovell SheehanJenkins noted no further workup needed, TBI team therapies  Nasal FX, R maxillary sinus FX - Dr. Pollyann Kennedyosen as OP if desired in 1 week L elbow dislocation - elbow relocated by Dr. Lajoyce Cornersuda, Dr. August Saucerean okay with elbow ROM 90* to full flexion R distal femur and patella FX/tibial plateau FX - Dr. Roda ShuttersXu ordered MR which shows complete ACL tear, Likely surgery 6/22  ABL anemia - multifactorial, check in AM R 2,4,5 MC Fx with displacement - Dr. Izora Ribasoley rec. Splint and OR  for ORIF, he will coordinate Pulmonary contusion - pulmonary toilet, IS to 1250 Grade 1 liver lac, abdominal wall abrasion - check Hb in AM FEN - clears and NPO at MN Dispo - to floor  LOS: 3 days    Violeta GelinasBurke Thompson, MD, MPH, FACS Trauma: [phone] General Surgery: [phone]  01/01/2014

## 2014-01-01 NOTE — H&P (Signed)
H&P update  The surgical history has been reviewed and remains accurate without interval change.  The patient was re-examined and patient's physiologic condition has not changed significantly in the last 30 days. The condition still exists that makes this procedure necessary. The treatment plan remains the same, without new options for care.  No new pharmacological allergies or types of therapy has been initiated that would change the plan or the appropriateness of the plan.  The patient and/or family understand the potential benefits and risks.  [name]. Michael Xu, MD 01/01/2014 9:03 PM

## 2014-01-02 ENCOUNTER — Inpatient Hospital Stay (HOSPITAL_COMMUNITY): Payer: BC Managed Care – PPO | Admitting: Anesthesiology

## 2014-01-02 ENCOUNTER — Encounter (HOSPITAL_COMMUNITY): Payer: BC Managed Care – PPO | Admitting: Anesthesiology

## 2014-01-02 ENCOUNTER — Encounter (HOSPITAL_COMMUNITY): Payer: Self-pay | Admitting: Anesthesiology

## 2014-01-02 ENCOUNTER — Inpatient Hospital Stay (HOSPITAL_COMMUNITY): Payer: BC Managed Care – PPO

## 2014-01-02 ENCOUNTER — Encounter (HOSPITAL_COMMUNITY): Admission: EM | Disposition: A | Discharge status: Rehabilitation Facility | Payer: Self-pay | Source: Home / Self Care

## 2014-01-02 DIAGNOSIS — D62 Acute posthemorrhagic anemia: Secondary | ICD-10-CM

## 2014-01-02 HISTORY — PX: ORIF FEMUR FRACTURE: SHX2119

## 2014-01-02 LAB — COMPREHENSIVE METABOLIC PANEL
ALBUMIN: 2.8 g/dL — AB (ref 3.5–5.2)
ALK PHOS: 59 U/L (ref 39–117)
ALT: 63 U/L — ABNORMAL HIGH (ref 0–53)
AST: 48 U/L — AB (ref 0–37)
BILIRUBIN TOTAL: 0.8 mg/dL (ref 0.3–1.2)
BUN: 7 mg/dL (ref 6–23)
CHLORIDE: 98 meq/L (ref 96–112)
CO2: 28 mEq/L (ref 19–32)
Calcium: 8.3 mg/dL — ABNORMAL LOW (ref 8.4–10.5)
Creatinine, Ser: 0.72 mg/dL (ref 0.50–1.35)
GFR calc Af Amer: >90 mL/min (ref >90)
Glucose, Bld: 103 mg/dL — ABNORMAL HIGH (ref 70–99)
POTASSIUM: 4.3 meq/L (ref 3.7–5.3)
Sodium: 135 mEq/L — ABNORMAL LOW (ref 137–147)
Total Protein: 5.9 g/dL — ABNORMAL LOW (ref 6.0–8.3)

## 2014-01-02 LAB — CBC
HCT: 25.1 % — ABNORMAL LOW (ref 39.0–52.0)
HCT: 32 % — ABNORMAL LOW (ref 39.0–52.0)
HEMOGLOBIN: 10.8 g/dL — AB (ref 13.0–17.0)
Hemoglobin: 8.4 g/dL — ABNORMAL LOW (ref 13.0–17.0)
MCH: 28.6 pg (ref 26.0–34.0)
MCH: 28.5 pg (ref 26.0–34.0)
MCHC: 33.5 g/dL (ref 30.0–36.0)
MCHC: 33.8 g/dL (ref 30.0–36.0)
MCV: 85.4 fL (ref 78.0–100.0)
MCV: 84.4 fL (ref 78.0–100.0)
PLATELETS: 162 10*3/uL (ref 150–400)
Platelets: 153 10*3/uL (ref 150–400)
RBC: 2.94 MIL/uL — AB (ref 4.22–5.81)
RBC: 3.79 MIL/uL — AB (ref 4.22–5.81)
RDW: 12.5 % (ref 11.5–15.5)
RDW: 13.2 % (ref 11.5–15.5)
WBC: 7.8 10*3/uL (ref 4.0–10.5)
WBC: 8.6 10*3/uL (ref 4.0–10.5)

## 2014-01-02 LAB — PREPARE RBC (CROSSMATCH)

## 2014-01-02 SURGERY — OPEN REDUCTION INTERNAL FIXATION (ORIF) DISTAL FEMUR FRACTURE
Anesthesia: General | Site: Knee | Laterality: Right

## 2014-01-02 MED ORDER — OXYCODONE HCL 5 MG PO TABS
5.0000 mg | ORAL_TABLET | ORAL | Status: DC | PRN
  Administered 2014-01-02: 5 mg via ORAL

## 2014-01-02 MED ORDER — ROCURONIUM BROMIDE 50 MG/5ML IV SOLN
INTRAVENOUS | Status: AC
  Filled 2014-01-02: qty 1

## 2014-01-02 MED ORDER — ONDANSETRON HCL 4 MG/2ML IJ SOLN
INTRAMUSCULAR | Status: AC
  Filled 2014-01-02: qty 2

## 2014-01-02 MED ORDER — METHOCARBAMOL 500 MG PO TABS
500.0000 mg | ORAL_TABLET | Freq: Four times a day (QID) | ORAL | Status: DC | PRN
  Administered 2014-01-03 – 2014-01-04 (×5): 500 mg via ORAL
  Filled 2014-01-02 (×5): qty 1

## 2014-01-02 MED ORDER — CLINDAMYCIN PHOSPHATE 600 MG/50ML IV SOLN
600.0000 mg | Freq: Four times a day (QID) | INTRAVENOUS | Status: AC
  Administered 2014-01-02 – 2014-01-03 (×2): 600 mg via INTRAVENOUS
  Filled 2014-01-02 (×2): qty 50

## 2014-01-02 MED ORDER — OXYCODONE HCL 5 MG PO TABS
ORAL_TABLET | ORAL | Status: AC
  Filled 2014-01-02: qty 1

## 2014-01-02 MED ORDER — PROPOFOL 10 MG/ML IV BOLUS
INTRAVENOUS | Status: DC | PRN
  Administered 2014-01-02: 150 mg via INTRAVENOUS

## 2014-01-02 MED ORDER — ENOXAPARIN SODIUM 40 MG/0.4ML ~~LOC~~ SOLN: 40.0000 mg | Freq: Every day | SUBCUTANEOUS | Status: DC

## 2014-01-02 MED ORDER — SUCCINYLCHOLINE CHLORIDE 20 MG/ML IJ SOLN
INTRAMUSCULAR | Status: AC
  Filled 2014-01-02: qty 1

## 2014-01-02 MED ORDER — SENNA 8.6 MG PO TABS
1.0000 | ORAL_TABLET | Freq: Two times a day (BID) | ORAL | Status: DC
  Administered 2014-01-02 – 2014-01-05 (×5): 8.6 mg via ORAL
  Filled 2014-01-02 (×9): qty 1

## 2014-01-02 MED ORDER — ACETAMINOPHEN 325 MG PO TABS: 650.0000 mg | ORAL_TABLET | Freq: Four times a day (QID) | ORAL | Status: DC | PRN

## 2014-01-02 MED ORDER — ONDANSETRON HCL 4 MG/2ML IJ SOLN
INTRAMUSCULAR | Status: DC | PRN
  Administered 2014-01-02: 4 mg via INTRAVENOUS

## 2014-01-02 MED ORDER — FENTANYL CITRATE 0.05 MG/ML IJ SOLN
INTRAMUSCULAR | Status: DC | PRN
  Administered 2014-01-02 (×2): 50 ug via INTRAVENOUS
  Administered 2014-01-02: 100 ug via INTRAVENOUS
  Administered 2014-01-02 (×4): 50 ug via INTRAVENOUS

## 2014-01-02 MED ORDER — SODIUM CHLORIDE 0.9 % IV SOLN
INTRAVENOUS | Status: DC
  Administered 2014-01-03 (×2): via INTRAVENOUS

## 2014-01-02 MED ORDER — NEOSTIGMINE METHYLSULFATE 10 MG/10ML IV SOLN
INTRAVENOUS | Status: AC
  Filled 2014-01-02: qty 1

## 2014-01-02 MED ORDER — LIDOCAINE HCL (CARDIAC) 20 MG/ML IV SOLN
INTRAVENOUS | Status: DC | PRN
  Administered 2014-01-02: 60 mg via INTRAVENOUS

## 2014-01-02 MED ORDER — MORPHINE SULFATE 2 MG/ML IJ SOLN
0.5000 mg | INTRAMUSCULAR | Status: DC | PRN
  Administered 2014-01-02 – 2014-01-03 (×2): 0.5 mg via INTRAVENOUS
  Filled 2014-01-02 (×2): qty 1

## 2014-01-02 MED ORDER — MENTHOL 3 MG MT LOZG: 1.0000 | LOZENGE | OROMUCOSAL | Status: DC | PRN

## 2014-01-02 MED ORDER — METHOCARBAMOL 500 MG PO TABS
ORAL_TABLET | ORAL | Status: AC
  Filled 2014-01-02: qty 1

## 2014-01-02 MED ORDER — METHOCARBAMOL 1000 MG/10ML IJ SOLN: 500.0000 mg | Freq: Four times a day (QID) | INTRAVENOUS | Status: DC | PRN

## 2014-01-02 MED ORDER — HYDROMORPHONE HCL PF 1 MG/ML IJ SOLN
INTRAMUSCULAR | Status: AC
  Filled 2014-01-02: qty 1

## 2014-01-02 MED ORDER — FENTANYL CITRATE 0.05 MG/ML IJ SOLN
INTRAMUSCULAR | Status: AC
  Filled 2014-01-02: qty 5

## 2014-01-02 MED ORDER — ALUM & MAG HYDROXIDE-SIMETH 200-200-20 MG/5ML PO SUSP: 30.0000 mL | ORAL | Status: DC | PRN

## 2014-01-02 MED ORDER — ONDANSETRON HCL 4 MG/2ML IJ SOLN: 4.0000 mg | Freq: Four times a day (QID) | INTRAMUSCULAR | Status: DC | PRN

## 2014-01-02 MED ORDER — ONDANSETRON HCL 4 MG/2ML IJ SOLN: 4.0000 mg | Freq: Once | INTRAMUSCULAR | Status: DC | PRN

## 2014-01-02 MED ORDER — HYDROCODONE-ACETAMINOPHEN 5-325 MG PO TABS
1.0000 | ORAL_TABLET | Freq: Four times a day (QID) | ORAL | Status: DC | PRN
  Administered 2014-01-02: 2 via ORAL
  Filled 2014-01-02: qty 2

## 2014-01-02 MED ORDER — OXYCODONE HCL 5 MG PO TABS: 5.0000 mg | ORAL_TABLET | ORAL | Status: DC | PRN

## 2014-01-02 MED ORDER — PHENOL 1.4 % MT LIQD: 1.0000 | OROMUCOSAL | Status: DC | PRN

## 2014-01-02 MED ORDER — LACTATED RINGERS IV SOLN
INTRAVENOUS | Status: DC | PRN
  Administered 2014-01-02 (×3): via INTRAVENOUS

## 2014-01-02 MED ORDER — GLYCOPYRROLATE 0.2 MG/ML IJ SOLN
INTRAMUSCULAR | Status: DC | PRN
  Administered 2014-01-02: 0.6 mg via INTRAVENOUS

## 2014-01-02 MED ORDER — HYDROMORPHONE HCL PF 1 MG/ML IJ SOLN
0.2500 mg | INTRAMUSCULAR | Status: DC | PRN
  Administered 2014-01-02 (×4): 0.5 mg via INTRAVENOUS

## 2014-01-02 MED ORDER — PHENYLEPHRINE HCL 10 MG/ML IJ SOLN
INTRAMUSCULAR | Status: DC | PRN
  Administered 2014-01-02 (×3): 80 ug via INTRAVENOUS

## 2014-01-02 MED ORDER — METOCLOPRAMIDE HCL 10 MG PO TABS: 5.0000 mg | ORAL_TABLET | Freq: Three times a day (TID) | ORAL | Status: DC | PRN

## 2014-01-02 MED ORDER — SODIUM CHLORIDE 0.9 % IV SOLN
INTRAVENOUS | Status: DC | PRN
  Administered 2014-01-02: 17:00:00 via INTRAVENOUS

## 2014-01-02 MED ORDER — POLYETHYLENE GLYCOL 3350 17 G PO PACK
17.0000 g | PACK | Freq: Every day | ORAL | Status: DC
  Administered 2014-01-03 – 2014-01-04 (×2): 17 g via ORAL
  Filled 2014-01-02 (×5): qty 1

## 2014-01-02 MED ORDER — ENOXAPARIN SODIUM 40 MG/0.4ML ~~LOC~~ SOLN
40.0000 mg | SUBCUTANEOUS | Status: DC
  Administered 2014-01-03 – 2014-01-06 (×3): 40 mg via SUBCUTANEOUS
  Filled 2014-01-02 (×5): qty 0.4

## 2014-01-02 MED ORDER — METOCLOPRAMIDE HCL 5 MG/ML IJ SOLN: 5.0000 mg | Freq: Three times a day (TID) | INTRAMUSCULAR | Status: DC | PRN

## 2014-01-02 MED ORDER — OXYCODONE HCL 5 MG PO TABS
5.0000 mg | ORAL_TABLET | ORAL | Status: DC | PRN
  Administered 2014-01-02 – 2014-01-03 (×2): 10 mg via ORAL
  Filled 2014-01-02 (×2): qty 2

## 2014-01-02 MED ORDER — SUCCINYLCHOLINE CHLORIDE 20 MG/ML IJ SOLN
INTRAMUSCULAR | Status: DC | PRN
  Administered 2014-01-02: 100 mg via INTRAVENOUS

## 2014-01-02 MED ORDER — PHENYLEPHRINE 40 MCG/ML (10ML) SYRINGE FOR IV PUSH (FOR BLOOD PRESSURE SUPPORT)
PREFILLED_SYRINGE | INTRAVENOUS | Status: AC
  Filled 2014-01-02: qty 10

## 2014-01-02 MED ORDER — 0.9 % SODIUM CHLORIDE (POUR BTL) OPTIME
TOPICAL | Status: DC | PRN
  Administered 2014-01-02: 1000 mL

## 2014-01-02 MED ORDER — DOCUSATE SODIUM 100 MG PO CAPS
100.0000 mg | ORAL_CAPSULE | Freq: Two times a day (BID) | ORAL | Status: DC
  Administered 2014-01-02 – 2014-01-03 (×3): 100 mg via ORAL
  Filled 2014-01-02 (×7): qty 1

## 2014-01-02 MED ORDER — NEOSTIGMINE METHYLSULFATE 10 MG/10ML IV SOLN
INTRAVENOUS | Status: DC | PRN
  Administered 2014-01-02: 3 mg via INTRAVENOUS

## 2014-01-02 MED ORDER — MIDAZOLAM HCL 2 MG/2ML IJ SOLN
INTRAMUSCULAR | Status: AC
  Filled 2014-01-02: qty 2

## 2014-01-02 MED ORDER — HYDROMORPHONE HCL PF 1 MG/ML IJ SOLN
0.5000 mg | INTRAMUSCULAR | Status: DC | PRN
  Administered 2014-01-02: 0.5 mg via INTRAVENOUS
  Filled 2014-01-02: qty 1

## 2014-01-02 MED ORDER — SENNOSIDES-DOCUSATE SODIUM 8.6-50 MG PO TABS: 1.0000 | ORAL_TABLET | Freq: Every evening | ORAL | Status: DC | PRN

## 2014-01-02 MED ORDER — ACETAMINOPHEN 650 MG RE SUPP: 650.0000 mg | Freq: Four times a day (QID) | RECTAL | Status: DC | PRN

## 2014-01-02 MED ORDER — BACITRACIN-NEOMYCIN-POLYMYXIN 400-5-5000 EX OINT
TOPICAL_OINTMENT | CUTANEOUS | Status: AC
  Filled 2014-01-02: qty 1

## 2014-01-02 MED ORDER — ROCURONIUM BROMIDE 100 MG/10ML IV SOLN
INTRAVENOUS | Status: DC | PRN
  Administered 2014-01-02 (×2): 20 mg via INTRAVENOUS

## 2014-01-02 MED ORDER — MIDAZOLAM HCL 5 MG/5ML IJ SOLN
INTRAMUSCULAR | Status: DC | PRN
  Administered 2014-01-02: 2 mg via INTRAVENOUS

## 2014-01-02 MED ORDER — GLYCOPYRROLATE 0.2 MG/ML IJ SOLN
INTRAMUSCULAR | Status: AC
  Filled 2014-01-02: qty 3

## 2014-01-02 MED ORDER — SODIUM CHLORIDE 0.9 % IR SOLN
Status: DC | PRN
  Administered 2014-01-02: 3000 mL

## 2014-01-02 MED ORDER — ONDANSETRON HCL 4 MG PO TABS: 4.0000 mg | ORAL_TABLET | Freq: Four times a day (QID) | ORAL | Status: DC | PRN

## 2014-01-02 SURGICAL SUPPLY — 55 items
BIT DRILL 5.0 QC 6.5 (BIT) ×2 IMPLANT
BIT DRILL 5.0 QC 6.5MM (BIT) ×1
BLADE SURG ROTATE 9660 (MISCELLANEOUS) ×3 IMPLANT
BNDG COHESIVE 4X5 TAN STRL (GAUZE/BANDAGES/DRESSINGS) ×3 IMPLANT
BNDG ELASTIC 6X10 VLCR STRL LF (GAUZE/BANDAGES/DRESSINGS) ×3 IMPLANT
CANISTER SUCT 3000ML (MISCELLANEOUS) ×3 IMPLANT
DRAPE C-ARM 42X72 X-RAY (DRAPES) ×3 IMPLANT
DRAPE C-ARMOR (DRAPES) ×3 IMPLANT
DRAPE IMP U-DRAPE 54X76 (DRAPES) ×6 IMPLANT
DRAPE ORTHO SPLIT 77X108 STRL (DRAPES) ×4
DRAPE PROXIMA HALF (DRAPES) ×6 IMPLANT
DRAPE SURG ORHT 6 SPLT 77X108 (DRAPES) ×2 IMPLANT
DRAPE U-SHAPE 47X51 STRL (DRAPES) ×3 IMPLANT
DRSG PAD ABDOMINAL 8X10 ST (GAUZE/BANDAGES/DRESSINGS) ×3 IMPLANT
ELECT REM PT RETURN 9FT ADLT (ELECTROSURGICAL) ×3
ELECTRODE REM PT RTRN 9FT ADLT (ELECTROSURGICAL) ×1 IMPLANT
EVACUATOR 1/8 PVC DRAIN (DRAIN) ×3 IMPLANT
GAUZE XEROFORM 5X9 LF (GAUZE/BANDAGES/DRESSINGS) ×3 IMPLANT
GLOVE SURG SS PI 7.5 STRL IVOR (GLOVE) ×6 IMPLANT
GOWN STRL REIN XL XLG (GOWN DISPOSABLE) ×3 IMPLANT
GOWN STRL REUS W/ TWL LRG LVL3 (GOWN DISPOSABLE) ×2 IMPLANT
GOWN STRL REUS W/TWL LRG LVL3 (GOWN DISPOSABLE) ×4
HANDPIECE INTERPULSE COAX TIP (DISPOSABLE) ×2
K-WIRE 2.0 (WIRE) ×6
K-WIRE TROCAR PT 2.0 150MM (WIRE) ×3
KIT BASIN OR (CUSTOM PROCEDURE TRAY) ×3 IMPLANT
KIT ROOM TURNOVER OR (KITS) ×3 IMPLANT
KWIRE TROCAR PT 2.0 150MM (WIRE) ×3 IMPLANT
MANIFOLD NEPTUNE II (INSTRUMENTS) ×3 IMPLANT
NS IRRIG 1000ML POUR BTL (IV SOLUTION) ×3 IMPLANT
PACK GENERAL/GYN (CUSTOM PROCEDURE TRAY) ×3 IMPLANT
PAD ARMBOARD 7.5X6 YLW CONV (MISCELLANEOUS) ×6 IMPLANT
PADDING CAST SYN 6 (CAST SUPPLIES) ×2
PADDING CAST SYNTHETIC 4 (CAST SUPPLIES) ×2
PADDING CAST SYNTHETIC 4X4 STR (CAST SUPPLIES) ×1 IMPLANT
PADDING CAST SYNTHETIC 6X4 NS (CAST SUPPLIES) ×1 IMPLANT
PIN GUIDE 3.2X300MM (PIN) ×6 IMPLANT
SCREW CANN PT 80X22X6.5 (Screw) ×1 IMPLANT
SCREW CANNULATED 6.5X80 (Screw) ×2 IMPLANT
SCREW THREADED 6.5X85 (Screw) ×3 IMPLANT
SET HNDPC FAN SPRY TIP SCT (DISPOSABLE) ×1 IMPLANT
SPONGE GAUZE 4X4 12PLY STER LF (GAUZE/BANDAGES/DRESSINGS) ×3 IMPLANT
STAPLER VISISTAT 35W (STAPLE) ×3 IMPLANT
STOCKINETTE IMPERVIOUS LG (DRAPES) ×3 IMPLANT
SUT ETHILON 2 0 FS 18 (SUTURE) IMPLANT
SUT PDS AB 1 CT  36 (SUTURE) ×4
SUT PDS AB 1 CT 36 (SUTURE) ×2 IMPLANT
SUT VIC AB 0 CT1 27 (SUTURE) ×4
SUT VIC AB 0 CT1 27XBRD ANBCTR (SUTURE) ×2 IMPLANT
SUT VIC AB 2-0 CT1 36 (SUTURE) ×6 IMPLANT
TOWEL OR 17X24 6PK STRL BLUE (TOWEL DISPOSABLE) ×3 IMPLANT
TOWEL OR 17X26 10 PK STRL BLUE (TOWEL DISPOSABLE) ×6 IMPLANT
WASHER 6.5 (Orthopedic Implant) ×2 IMPLANT
WASHER CANN 12.7 NS (Orthopedic Implant) ×4 IMPLANT
WATER STERILE IRR 1000ML POUR (IV SOLUTION) IMPLANT

## 2014-01-02 NOTE — Progress Notes (Signed)
Patient to surgery today.  To have his hand and leg repaired today.  [name], [name], [name], [name] Trauma Surgeon

## 2014-01-02 NOTE — Progress Notes (Signed)
SLP Cancellation Note  Patient Details Name: [name] MRN: [id number] DOB: 01/17/1986   Cancelled treatment:        Pt. In surgery.   Maryjo RochesterWillis, Lori T 01/02/2014, 1:30 PM
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
## 2014-01-02 NOTE — Anesthesia Postprocedure Evaluation (Signed)
Anesthesia Post Note  Patient: [name]  Procedure(s) Performed: Procedure(s) (LRB): OPEN REDUCTION INTERNAL FIXATION (ORIF) DISTAL FEMUR FRACTURE (Right)  Anesthesia type: general  Patient location: PACU  Post pain: Pain level controlled  Post assessment: Patient's Cardiovascular Status Stable  Last Vitals:  Filed Vitals:   01/02/14 1930  BP: 109/72  Pulse: 91  Temp:   Resp: 8    Post vital signs: Reviewed and stable  Level of consciousness: sedated  Complications: No apparent anesthesia complications

## 2014-01-02 NOTE — Transfer of Care (Signed)
Immediate Anesthesia Transfer of Care Note  Patient: [name]  Procedure(s) Performed: Procedure(s): OPEN REDUCTION INTERNAL FIXATION (ORIF) DISTAL FEMUR FRACTURE (Right)  Patient Location: PACU  Anesthesia Type:General  Level of Consciousness: awake, alert , lethargic and responds to stimulation  Airway & Oxygen Therapy: Patient Spontanous Breathing and Patient connected to nasal cannula oxygen  Post-op Assessment: Report given to PACU RN and Post -op Vital signs reviewed and stable  Post vital signs: Reviewed and stable  Complications: No apparent anesthesia complications

## 2014-01-02 NOTE — Progress Notes (Signed)
Orthopedic Tech Progress Note Patient Details:  Simone CuriaSinclair Sarracino 1985-09-21 [id number] Unable to use overhead frame Patient ID: Simone CuriaSinclair Laver, male   DOB: 1985-09-21, 28 y.o.   MRN: [id number]   Jennye MoccasinHughes, Anthony Craig 01/02/2014, 10:12 PM

## 2014-01-02 NOTE — Anesthesia Procedure Notes (Signed)
Procedure Name: Intubation Date/Time: 01/02/2014 3:42 PM Performed by: Delia ChimesVOSH, ASHLEY E Pre-anesthesia Checklist: Patient identified, Timeout performed, Emergency Drugs available, Suction available and Patient being monitored Patient Re-evaluated:Patient Re-evaluated prior to inductionOxygen Delivery Method: Circle system utilized Preoxygenation: Pre-oxygenation with 100% oxygen Intubation Type: IV induction and Cricoid Pressure applied Ventilation: Mask ventilation without difficulty and Oral airway inserted - appropriate to patient size Laryngoscope Size: Mac and 3 Grade View: Grade I Tube type: Oral Tube size: 7.5 mm Number of attempts: 1 Airway Equipment and Method: Stylet and Oral airway Placement Confirmation: ETT inserted through vocal cords under direct vision,  breath sounds checked- equal and bilateral and positive ETCO2 Secured at: 23 cm Tube secured with: Tape Dental Injury: Teeth and Oropharynx as per pre-operative assessment

## 2014-01-02 NOTE — Anesthesia Preprocedure Evaluation (Addendum)
Anesthesia Evaluation  Patient identified by MRN, date of birth, ID band Patient awake    Reviewed: Allergy & Precautions, H&P , NPO status , Patient's Chart, lab work & pertinent test results  Airway Mallampati: II TM Distance: <3 FB   Mouth opening: Limited Mouth Opening  Dental  (+) Teeth Intact, Dental Advisory Given   Pulmonary          Cardiovascular     Neuro/Psych    GI/Hepatic   Endo/Other    Renal/GU      Musculoskeletal   Abdominal   Peds  Hematology   Anesthesia Other Findings Pt. Has beard.  Reproductive/Obstetrics                          Anesthesia Physical Anesthesia Plan  ASA: I  Anesthesia Plan: General   Post-op Pain Management:    Induction: Intravenous  Airway Management Planned: Oral ETT  Additional Equipment:   Intra-op Plan:   Post-operative Plan: Extubation in OR  Informed Consent: I have reviewed the patients History and Physical, chart, labs and discussed the procedure including the risks, benefits and alternatives for the proposed anesthesia with the patient or authorized representative who has indicated his/her understanding and acceptance.     Plan Discussed with:   Anesthesia Plan Comments:         Anesthesia Quick Evaluation

## 2014-01-02 NOTE — Progress Notes (Signed)
Patient ID: Simone CuriaSinclair Canepa, male   DOB: 1985-12-14, 28 y.o.   MRN: [id number]   LOS: 4 days   Subjective: No new c/o.   Objective: Vital signs in last 24 hours: Temp:  [98.2 F (36.8 C)-98.9 F (37.2 C)] 98.2 F (36.8 C) (06/22 0549) Pulse Rate:  [88-99] 88 (06/22 0549) Resp:  [16-18] 16 (06/22 0549) BP: (109-131)/(58-72) 109/58 mmHg (06/22 0549) SpO2:  [93 %-96 %] 94 % (06/22 0549) Last BM Date: 12/29/13   Laboratory  CBC  Recent Labs  01/01/14 0219 01/02/14 0427  WBC 8.9 7.8  HGB 8.2* 8.4*  HCT 24.1* 25.1*  PLT 139* 162   BMET  Recent Labs  01/01/14 0219 01/02/14 0427  NA 136* 135*  K 4.4 4.3  CL 100 98  CO2 26 28  GLUCOSE 129* 103*  BUN 3* 7  CREATININE 0.75 0.72  CALCIUM 8.0* 8.3*    Physical Exam General appearance: alert and no distress Resp: clear to auscultation bilaterally Cardio: regular rate and rhythm GI: normal findings: bowel sounds normal and soft, non-tender   [facility]  TBI/SAH - Dr. Lovell SheehanJenkins noted no further workup needed, TBI team therapies  Nasal FX, R maxillary sinus FX - Dr. Pollyann Kennedyosen as OP if desired in 1 week  Pulmonary contusion - pulmonary toilet, IS to 1250  L elbow dislocation - elbow relocated by Dr. Lajoyce Cornersuda, Dr. August Saucerean okay with elbow ROM 90* to full flexion  R 2,4,5 MC Fx with displacement - Dr. Izora Ribasoley rec. splint and OR for ORIF, he will coordinate with Xu Grade 1 liver lac, abdominal wall abrasion - Hgb stable R distal femur and patella FX/tibial plateau FX - Dr. Roda ShuttersXu ordered MR which shows complete ACL tear,  surgery today  ABL anemia - Stable FEN - clears and NPO at MN  VTE -- SCD's, ?Lovenox tomorrow with mild TBI, liver lac, stable hgb, and significant DVT risk. Will d/w MD. Dispo - OR    Freeman CaldronMichael J. Jeffery, PA-C Pager: [phone] General Trauma PA Pager: [phone]  01/02/2014

## 2014-01-03 ENCOUNTER — Encounter (HOSPITAL_COMMUNITY): Payer: Self-pay | Admitting: Orthopaedic Surgery

## 2014-01-03 DIAGNOSIS — S36113A Laceration of liver, unspecified degree, initial encounter: Secondary | ICD-10-CM

## 2014-01-03 DIAGNOSIS — S82001A Unspecified fracture of right patella, initial encounter for closed fracture: Secondary | ICD-10-CM

## 2014-01-03 DIAGNOSIS — S53105A Unspecified dislocation of left ulnohumeral joint, initial encounter: Secondary | ICD-10-CM

## 2014-01-03 DIAGNOSIS — S0292XA Unspecified fracture of facial bones, initial encounter for closed fracture: Secondary | ICD-10-CM

## 2014-01-03 DIAGNOSIS — S82141A Displaced bicondylar fracture of right tibia, initial encounter for closed fracture: Secondary | ICD-10-CM

## 2014-01-03 DIAGNOSIS — S72401A Unspecified fracture of lower end of right femur, initial encounter for closed fracture: Secondary | ICD-10-CM

## 2014-01-03 DIAGNOSIS — S069X9A Unspecified intracranial injury with loss of consciousness of unspecified duration, initial encounter: Secondary | ICD-10-CM

## 2014-01-03 DIAGNOSIS — S62309A Unspecified fracture of unspecified metacarpal bone, initial encounter for closed fracture: Secondary | ICD-10-CM

## 2014-01-03 DIAGNOSIS — D62 Acute posthemorrhagic anemia: Secondary | ICD-10-CM | POA: Diagnosis not present

## 2014-01-03 DIAGNOSIS — S069XAA Unspecified intracranial injury with loss of consciousness status unknown, initial encounter: Secondary | ICD-10-CM

## 2014-01-03 DIAGNOSIS — S83519A Sprain of anterior cruciate ligament of unspecified knee, initial encounter: Secondary | ICD-10-CM

## 2014-01-03 LAB — BASIC METABOLIC PANEL
BUN: 9 mg/dL (ref 6–23)
CALCIUM: 8.1 mg/dL — AB (ref 8.4–10.5)
CO2: 26 meq/L (ref 19–32)
Chloride: 98 mEq/L (ref 96–112)
Creatinine, Ser: 0.66 mg/dL (ref 0.50–1.35)
GFR calc Af Amer: >90 mL/min (ref >90)
GFR calc non Af Amer: >90 mL/min (ref >90)
GLUCOSE: 106 mg/dL — AB (ref 70–99)
Potassium: 4.5 mEq/L (ref 3.7–5.3)
Sodium: 137 mEq/L (ref 137–147)

## 2014-01-03 LAB — CBC
HEMATOCRIT: 30.1 % — AB (ref 39.0–52.0)
HEMOGLOBIN: 10.2 g/dL — AB (ref 13.0–17.0)
MCH: 28.4 pg (ref 26.0–34.0)
MCHC: 33.9 g/dL (ref 30.0–36.0)
MCV: 83.8 fL (ref 78.0–100.0)
Platelets: 167 10*3/uL (ref 150–400)
RBC: 3.59 MIL/uL — AB (ref 4.22–5.81)
RDW: 13.5 % (ref 11.5–15.5)
WBC: 9.1 10*3/uL (ref 4.0–10.5)

## 2014-01-03 MED ORDER — OXYCODONE HCL 5 MG PO TABS
5.0000 mg | ORAL_TABLET | ORAL | Status: DC | PRN
  Administered 2014-01-03: 10 mg via ORAL
  Administered 2014-01-03 – 2014-01-06 (×8): 15 mg via ORAL
  Filled 2014-01-03: qty 2
  Filled 2014-01-03 (×8): qty 3

## 2014-01-03 MED ORDER — HYDROMORPHONE HCL PF 1 MG/ML IJ SOLN
0.5000 mg | INTRAMUSCULAR | Status: DC | PRN
  Administered 2014-01-03: 0.5 mg via INTRAVENOUS
  Filled 2014-01-03: qty 1

## 2014-01-03 MED ORDER — OXYCODONE HCL ER 20 MG PO T12A
20.0000 mg | EXTENDED_RELEASE_TABLET | Freq: Two times a day (BID) | ORAL | Status: DC
  Administered 2014-01-03 – 2014-01-06 (×8): 20 mg via ORAL
  Filled 2014-01-03 (×8): qty 1

## 2014-01-03 MED ORDER — CLINDAMYCIN PHOSPHATE 900 MG/50ML IV SOLN
INTRAVENOUS | Status: DC | PRN
  Administered 2014-01-02: 900 mg via INTRAVENOUS
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
## 2014-01-03 NOTE — Progress Notes (Signed)
Not much appetite, good BS. Will clarify timing of hand surgery. Eventual CIR. I spoke with his mother. Patient examined and I agree with the assessment and plan  Violeta GelinasBurke Thompson, MD, MPH, FACS Trauma: [phone] General Surgery: [phone]  01/03/2014 1:11 PM

## 2014-01-03 NOTE — Progress Notes (Signed)
Patient ID: [name], male   DOB: Apr 12, 1986, 28 y.o.   MRN: [id number]   LOS: 5 days   Subjective: Doing ok, pain meds working.   Objective: Vital signs in last 24 hours: Temp:  [98.4 F (36.9 C)-99.4 F (37.4 C)] 99.3 F (37.4 C) (06/23 0552) Pulse Rate:  [88-104] 104 (06/23 0552) Resp:  [8-18] 16 (06/23 0552) BP: (104-138)/(51-91) 122/63 mmHg (06/23 0552) SpO2:  [96 %-100 %] 97 % (06/23 0552) Last BM Date: 12/29/13   Laboratory  CBC  Recent Labs  01/02/14 1850 01/03/14 0452  WBC 8.6 9.1  HGB 10.8* 10.2*  HCT 32.0* 30.1*  PLT 153 167   BMET  Recent Labs  01/02/14 0427 01/03/14 0452  NA 135* 137  K 4.3 4.5  CL 98 98  CO2 28 26  GLUCOSE 103* 106*  BUN 7 9  CREATININE 0.72 0.66  CALCIUM 8.3* 8.1*    Physical Exam General appearance: alert and no distress Resp: clear to auscultation bilaterally Cardio: regular rate and rhythm GI: normal findings: bowel sounds normal and soft, non-tender Extremities: NVI   [facility]  TBI/SAH - Dr. Lovell SheehanJenkins noted no further workup needed, TBI team therapies  Nasal FX, R maxillary sinus FX - Dr. Pollyann Kennedyosen as OP if desired in 1 week  Pulmonary contusion  L elbow dislocation s/p CR - NWB, Dr. August Saucerean okay with elbow ROM 90* to full flexion  R 2,4,5 MC Fx with displacement - Dr. Izora Ribasoley rec. splint and OR for ORIF Grade 1 liver lac, abdominal wall abrasion - Hgb stable  R distal femur and patella FX/tibial plateau FX s/p ORIF femur - NWB, Blesdoe brace. Will need ligamentous repair later. ABL anemia - Stable after transfusion yesterday FEN - D/C foley, SL IV, change pain meds VTE -- SCD's, Lovenox  Dispo - Will contact Dr. Izora Ribasoley about plans for fixation. PT/OT/ST.    Freeman CaldronMichael J. Jeffery, PA-C Pager: [phone] General Trauma PA Pager: [phone]  01/03/2014
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
## 2014-01-03 NOTE — Progress Notes (Signed)
   Subjective:  Patient reports pain as moderate.  No events  Objective:   VITALS:   Filed Vitals:   01/02/14 1958 01/03/14 0047 01/03/14 0400 01/03/14 0552  BP: 132/73 123/69  122/63  Pulse: 90 94  104  Temp: 99 F (37.2 C) 99.4 F (37.4 C)  99.3 F (37.4 C)  TempSrc:      Resp: 14 16 18 16   Height:      Weight:      SpO2: 99% 97% 98% 97%    Neurologically intact Neurovascular intact Sensation intact distally Intact pulses distally Dorsiflexion/Plantar flexion intact Incision: dressing C/D/I and no drainage No cellulitis present Compartment soft   Lab Results  Component Value Date   WBC 9.1 01/03/2014   HGB 10.2* 01/03/2014   HCT 30.1* 01/03/2014   MCV 83.8 01/03/2014   PLT 167 01/03/2014     Assessment/Plan:  1 Day Post-Op   - Expected postop acute blood loss anemia - will monitor for symptoms - Up with PT/OT - DVT ppx - SCDs, ambulation, lovenox - NWB right extremity - bledsoe brace ordered - Pain control - continue HVAC for now  Cheral AlmasXu, Naiping Michael 01/03/2014, 8:18 AM [phone]
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
## 2014-01-03 NOTE — Clinical Social Work Note (Signed)
Clinical Social Worker continuing to follow patient and patient family for support and discharge planning needs.  CSW attempted to reach out to patient to complete SBIRT assessment, however patient sleeping at this time.  Patient mother present at bedside and hopeful for inpatient rehab at discharge.  CSW remains available for support and to complete SBIRT assessment once appropriate.  Macario GoldsJesse Scinto, [place]CS[place] [phone]

## 2014-01-03 NOTE — Progress Notes (Signed)
UR completed.  Michelle Bryson, RN BSN [facility] CCM Trauma/Neuro ICU Case Manager [phone]  

## 2014-01-03 NOTE — Addendum Note (Signed)
Addendum created 01/03/14 0706 by Nelson ChimesAshley E Vosh, CRNA   Modules edited: Anesthesia Medication Administration

## 2014-01-04 ENCOUNTER — Encounter (HOSPITAL_COMMUNITY): Admission: EM | Disposition: A | Discharge status: Rehabilitation Facility | Payer: Self-pay | Source: Home / Self Care

## 2014-01-04 ENCOUNTER — Encounter (HOSPITAL_COMMUNITY): Payer: Self-pay | Admitting: Anesthesiology

## 2014-01-04 ENCOUNTER — Inpatient Hospital Stay (HOSPITAL_COMMUNITY): Payer: BC Managed Care – PPO | Admitting: Anesthesiology

## 2014-01-04 ENCOUNTER — Encounter (HOSPITAL_COMMUNITY): Payer: BC Managed Care – PPO | Admitting: Anesthesiology

## 2014-01-04 HISTORY — PX: PERCUTANEOUS PINNING: SHX2209

## 2014-01-04 LAB — CBC
HCT: 29.6 % — ABNORMAL LOW (ref 39.0–52.0)
Hemoglobin: 9.9 g/dL — ABNORMAL LOW (ref 13.0–17.0)
MCH: 28.3 pg (ref 26.0–34.0)
MCHC: 33.4 g/dL (ref 30.0–36.0)
MCV: 84.6 fL (ref 78.0–100.0)
PLATELETS: 192 10*3/uL (ref 150–400)
RBC: 3.5 MIL/uL — ABNORMAL LOW (ref 4.22–5.81)
RDW: 13.5 % (ref 11.5–15.5)
WBC: 9.6 10*3/uL (ref 4.0–10.5)

## 2014-01-04 LAB — BASIC METABOLIC PANEL
BUN: 10 mg/dL (ref 6–23)
CALCIUM: 8.5 mg/dL (ref 8.4–10.5)
CO2: 26 mEq/L (ref 19–32)
Chloride: 93 mEq/L — ABNORMAL LOW (ref 96–112)
Creatinine, Ser: 0.54 mg/dL (ref 0.50–1.35)
GFR calc Af Amer: >90 mL/min (ref >90)
Glucose, Bld: 116 mg/dL — ABNORMAL HIGH (ref 70–99)
Potassium: 4.5 mEq/L (ref 3.7–5.3)
Sodium: 132 mEq/L — ABNORMAL LOW (ref 137–147)

## 2014-01-04 SURGERY — PINNING, EXTREMITY, PERCUTANEOUS
Anesthesia: General | Site: Hand | Laterality: Right

## 2014-01-04 MED ORDER — FENTANYL CITRATE 0.05 MG/ML IJ SOLN
INTRAMUSCULAR | Status: DC | PRN
  Administered 2014-01-04: 50 ug via INTRAVENOUS

## 2014-01-04 MED ORDER — PROPOFOL 10 MG/ML IV BOLUS
INTRAVENOUS | Status: AC
  Filled 2014-01-04: qty 20

## 2014-01-04 MED ORDER — LACTATED RINGERS IV SOLN
INTRAVENOUS | Status: DC
  Administered 2014-01-04: 14:00:00 via INTRAVENOUS

## 2014-01-04 MED ORDER — BUPIVACAINE HCL (PF) 0.25 % IJ SOLN
INTRAMUSCULAR | Status: AC
  Filled 2014-01-04: qty 30

## 2014-01-04 MED ORDER — OXYCODONE HCL 5 MG PO TABS: 5.0000 mg | ORAL_TABLET | Freq: Once | ORAL | Status: DC | PRN

## 2014-01-04 MED ORDER — PROPOFOL 10 MG/ML IV BOLUS
INTRAVENOUS | Status: DC | PRN
  Administered 2014-01-04: 130 mg via INTRAVENOUS

## 2014-01-04 MED ORDER — CLINDAMYCIN PHOSPHATE 600 MG/50ML IV SOLN
600.0000 mg | Freq: Once | INTRAVENOUS | Status: AC
Start: 2014-01-04 — End: 2014-01-04
  Administered 2014-01-04: 600 mg via INTRAVENOUS
  Filled 2014-01-04: qty 50

## 2014-01-04 MED ORDER — FENTANYL CITRATE 0.05 MG/ML IJ SOLN
INTRAMUSCULAR | Status: AC
  Filled 2014-01-04: qty 5

## 2014-01-04 MED ORDER — DOCUSATE SODIUM 100 MG PO CAPS
200.0000 mg | ORAL_CAPSULE | Freq: Two times a day (BID) | ORAL | Status: DC
  Administered 2014-01-04 – 2014-01-06 (×3): 200 mg via ORAL
  Filled 2014-01-04 (×5): qty 2

## 2014-01-04 MED ORDER — LACTATED RINGERS IV SOLN
INTRAVENOUS | Status: DC | PRN
  Administered 2014-01-04: 14:00:00 via INTRAVENOUS

## 2014-01-04 MED ORDER — POLYVINYL ALCOHOL 1.4 % OP SOLN
1.0000 [drp] | OPHTHALMIC | Status: DC | PRN
  Administered 2014-01-04: 1 [drp] via OPHTHALMIC
  Filled 2014-01-04: qty 15

## 2014-01-04 MED ORDER — MIDAZOLAM HCL 2 MG/2ML IJ SOLN
INTRAMUSCULAR | Status: AC
  Filled 2014-01-04: qty 2

## 2014-01-04 MED ORDER — MAGNESIUM CITRATE PO SOLN
1.0000 | Freq: Once | ORAL | Status: AC
  Administered 2014-01-04: 1 via ORAL
  Filled 2014-01-04: qty 296

## 2014-01-04 MED ORDER — ONDANSETRON HCL 4 MG/2ML IJ SOLN
INTRAMUSCULAR | Status: DC | PRN
  Administered 2014-01-04: 4 mg via INTRAVENOUS

## 2014-01-04 MED ORDER — BUPIVACAINE HCL (PF) 0.25 % IJ SOLN
INTRAMUSCULAR | Status: DC | PRN
  Administered 2014-01-04: 10 mL

## 2014-01-04 MED ORDER — LIDOCAINE HCL (CARDIAC) 20 MG/ML IV SOLN
INTRAVENOUS | Status: AC
  Filled 2014-01-04: qty 5

## 2014-01-04 MED ORDER — 0.9 % SODIUM CHLORIDE (POUR BTL) OPTIME
TOPICAL | Status: DC | PRN
  Administered 2014-01-04: 1000 mL

## 2014-01-04 MED ORDER — ROCURONIUM BROMIDE 50 MG/5ML IV SOLN
INTRAVENOUS | Status: AC
  Filled 2014-01-04: qty 1

## 2014-01-04 MED ORDER — HYDROMORPHONE HCL PF 1 MG/ML IJ SOLN: 0.2500 mg | INTRAMUSCULAR | Status: DC | PRN

## 2014-01-04 MED ORDER — ONDANSETRON HCL 4 MG/2ML IJ SOLN
INTRAMUSCULAR | Status: AC
  Filled 2014-01-04: qty 2

## 2014-01-04 MED ORDER — LIDOCAINE HCL (CARDIAC) 20 MG/ML IV SOLN
INTRAVENOUS | Status: DC | PRN
Start: 2014-01-04 — End: 2014-01-04
  Administered 2014-01-04: 60 mg via INTRAVENOUS

## 2014-01-04 MED ORDER — OXYCODONE HCL 5 MG/5ML PO SOLN: 5.0000 mg | Freq: Once | ORAL | Status: DC | PRN

## 2014-01-04 SURGICAL SUPPLY — 54 items
BANDAGE ELASTIC 3 VELCRO ST LF (GAUZE/BANDAGES/DRESSINGS) ×3 IMPLANT
BANDAGE ELASTIC 4 VELCRO ST LF (GAUZE/BANDAGES/DRESSINGS) IMPLANT
BANDAGE GAUZE ELAST BULKY 4 IN (GAUZE/BANDAGES/DRESSINGS) IMPLANT
BNDG COHESIVE 1X5 TAN STRL LF (GAUZE/BANDAGES/DRESSINGS) IMPLANT
CAP PIN ORTHO PINK (CAP) IMPLANT
CAP PIN PROTECTOR ORTHO WHT (CAP) IMPLANT
CORDS BIPOLAR (ELECTRODE) ×3 IMPLANT
COVER SURGICAL LIGHT HANDLE (MISCELLANEOUS) ×3 IMPLANT
CUFF TOURNIQUET SINGLE 18IN (TOURNIQUET CUFF) IMPLANT
CUFF TOURNIQUET SINGLE 24IN (TOURNIQUET CUFF) IMPLANT
DRAPE OEC MINIVIEW 54X84 (DRAPES) IMPLANT
DRAPE SURG 17X23 STRL (DRAPES) ×3 IMPLANT
GAUZE SPONGE 2X2 8PLY STRL LF (GAUZE/BANDAGES/DRESSINGS) IMPLANT
GAUZE XEROFORM 1X8 LF (GAUZE/BANDAGES/DRESSINGS) ×3 IMPLANT
GLOVE BIOGEL M STRL SZ7.5 (GLOVE) ×3 IMPLANT
GLOVE SS BIOGEL STRL SZ 8 (GLOVE) ×1 IMPLANT
GLOVE SUPERSENSE BIOGEL SZ 8 (GLOVE) ×2
GOWN STRL REUS W/ TWL LRG LVL3 (GOWN DISPOSABLE) ×2 IMPLANT
GOWN STRL REUS W/ TWL XL LVL3 (GOWN DISPOSABLE) ×3 IMPLANT
GOWN STRL REUS W/TWL LRG LVL3 (GOWN DISPOSABLE) ×4
GOWN STRL REUS W/TWL XL LVL3 (GOWN DISPOSABLE) ×6
K-WIRE SMTH SNGL TROCAR .028X4 (WIRE)
KIT BASIN OR (CUSTOM PROCEDURE TRAY) ×3 IMPLANT
KIT ROOM TURNOVER OR (KITS) ×3 IMPLANT
KWIRE SMTH SNGL TROCAR .028X4 (WIRE) IMPLANT
MANIFOLD NEPTUNE II (INSTRUMENTS) ×3 IMPLANT
NEEDLE HYPO 25GX1X1/2 BEV (NEEDLE) IMPLANT
NS IRRIG 1000ML POUR BTL (IV SOLUTION) ×3 IMPLANT
PACK ORTHO EXTREMITY (CUSTOM PROCEDURE TRAY) ×3 IMPLANT
PAD ARMBOARD 7.5X6 YLW CONV (MISCELLANEOUS) ×6 IMPLANT
PAD CAST 3X4 CTTN HI CHSV (CAST SUPPLIES) ×1 IMPLANT
PAD CAST 4YDX4 CTTN HI CHSV (CAST SUPPLIES) IMPLANT
PADDING CAST COTTON 3X4 STRL (CAST SUPPLIES) ×2
PADDING CAST COTTON 4X4 STRL (CAST SUPPLIES)
SOLUTION BETADINE 4OZ (MISCELLANEOUS) ×3 IMPLANT
SPLINT FIBERGLASS 4X30 (CAST SUPPLIES) ×3 IMPLANT
SPONGE GAUZE 2X2 STER 10/PKG (GAUZE/BANDAGES/DRESSINGS)
SPONGE GAUZE 4X4 12PLY (GAUZE/BANDAGES/DRESSINGS) IMPLANT
SPONGE GAUZE 4X4 12PLY STER LF (GAUZE/BANDAGES/DRESSINGS) ×3 IMPLANT
SPONGE SCRUB IODOPHOR (GAUZE/BANDAGES/DRESSINGS) ×3 IMPLANT
SUCTION FRAZIER TIP 10 FR DISP (SUCTIONS) IMPLANT
SUT ETHILON 5 0 PS 2 18 (SUTURE) ×3 IMPLANT
SUT MERSILENE 4 0 P 3 (SUTURE) IMPLANT
SUT PROLENE 4 0 PS 2 18 (SUTURE) IMPLANT
SUT VIC AB 2-0 CT1 27 (SUTURE)
SUT VIC AB 2-0 CT1 TAPERPNT 27 (SUTURE) IMPLANT
SYR CONTROL 10ML LL (SYRINGE) IMPLANT
SYSTEM FIXATION BONE (Anchor) ×3 IMPLANT
TOWEL OR 17X24 6PK STRL BLUE (TOWEL DISPOSABLE) ×3 IMPLANT
TOWEL OR 17X26 10 PK STRL BLUE (TOWEL DISPOSABLE) ×3 IMPLANT
TUBE CONNECTING 12'X1/4 (SUCTIONS)
TUBE CONNECTING 12X1/4 (SUCTIONS) IMPLANT
UNDERPAD 30X30 INCONTINENT (UNDERPADS AND DIAPERS) ×3 IMPLANT
WATER STERILE IRR 1000ML POUR (IV SOLUTION) ×3 IMPLANT

## 2014-01-04 NOTE — Progress Notes (Signed)
Patient ID: [name], male   DOB: 1986-05-14, 28 y.o.   MRN: [id number]   LOS: 6 days   Subjective: No new c/o.   Objective: Vital signs in last 24 hours: Temp:  [98.4 F (36.9 C)] 98.4 F (36.9 C) (06/24 0530) Pulse Rate:  [85-95] 85 (06/24 0530) Resp:  [14-16] 16 (06/24 0530) BP: (125-134)/(68) 134/68 mmHg (06/24 0530) SpO2:  [97 %-99 %] 99 % (06/24 0530) Last BM Date: 12/29/13   Laboratory  CBC  Recent Labs  01/03/14 0452 01/04/14 0452  WBC 9.1 9.6  HGB 10.2* 9.9*  HCT 30.1* 29.6*  PLT 167 192   BMET  Recent Labs  01/03/14 0452 01/04/14 0452  NA 137 132*  K 4.5 4.5  CL 98 93*  CO2 26 26  GLUCOSE 106* 116*  BUN 9 10  CREATININE 0.66 0.54  CALCIUM 8.1* 8.5    Physical Exam General appearance: alert and no distress Resp: clear to auscultation bilaterally Cardio: regular rate and rhythm GI: normal findings: bowel sounds normal and soft, non-tender   [facility]  TBI/SAH - Dr. Lovell SheehanJenkins noted no further workup needed, TBI team therapies  Nasal FX, R maxillary sinus FX - Dr. Pollyann Kennedyosen as OP if desired in 1 week  Pulmonary contusion  L elbow dislocation s/p CR - NWB, Dr. August Saucerean okay with elbow ROM 90* to full flexion  R 2,4,5 MC Fx with displacement - Dr. Izora Ribasoley rec. splint and OR for ORIF today  Grade 1 liver lac, abdominal wall abrasion - Hgb stable  R distal femur and patella FX/tibial plateau FX s/p ORIF femur - NWB, Blesdoe brace. Will need ligamentous repair later.  ABL anemia - Stable  FEN - Increase bowel regimen VTE -- SCD's, Lovenox  Dispo - PT/OT/ST.    Freeman CaldronMichael J. Jayse Hodkinson, PA-C Pager: [phone] General Trauma PA Pager: [phone]  01/04/2014

## 2014-01-04 NOTE — Anesthesia Procedure Notes (Signed)
Procedure Name: LMA Insertion Date/Time: 01/04/2014 2:15 PM Performed by: Leonel Ramsay'LAUGHLIN, KAREN H Pre-anesthesia Checklist: Patient identified, Patient being monitored, Emergency Drugs available, Timeout performed and Suction available Patient Re-evaluated:Patient Re-evaluated prior to inductionOxygen Delivery Method: Circle system utilized Preoxygenation: Pre-oxygenation with 100% oxygen Intubation Type: IV induction LMA: LMA inserted LMA Size: 4.0 Number of attempts: 1 Placement Confirmation: positive ETCO2 and breath sounds checked- equal and bilateral Tube secured with: Tape Dental Injury: Teeth and Oropharynx as per pre-operative assessment

## 2014-01-04 NOTE — Progress Notes (Signed)
   Subjective:  Patient reports pain as moderate.  No events  Objective:   VITALS:   Filed Vitals:   01/03/14 0552 01/03/14 1324 01/03/14 1944 01/04/14 0530  BP: 122/63  125/68 134/68  Pulse: 104  95 85  Temp: 99.3 F (37.4 C)  98.4 F (36.9 C) 98.4 F (36.9 C)  TempSrc:   Oral   Resp: 16  14 16   Height:      Weight:      SpO2: 97% 97% 97% 99%    Neurologically intact Neurovascular intact Sensation intact distally Intact pulses distally Dorsiflexion/Plantar flexion intact Incision: dressing C/D/I and no drainage No cellulitis present Compartment soft bledsoe brace    Lab Results  Component Value Date   WBC 9.6 01/04/2014   HGB 9.9* 01/04/2014   HCT 29.6* 01/04/2014   MCV 84.6 01/04/2014   PLT 192 01/04/2014     Assessment/Plan:  2 Days Post-Op   - Expected postop acute blood loss anemia - will monitor for symptoms - Up with PT/OT - CIR - DVT ppx - SCDs, ambulation, lovenox - NWB right extremity - Pain control - HVAC removed  Cheral AlmasXu, Naiping Michael 01/04/2014, 8:42 AM [phone]

## 2014-01-04 NOTE — Progress Notes (Signed)
PT Cancellation Note  Patient Details Name: [name] MRN: [id number] DOB: 08-29-1985   Cancelled Treatment:    Reason Eval/Treat Not Completed: Patient at procedure or test/unavailable. Patient scheduled for surgery today. Will follow up in AM   Robinette, Adline PotterJulia Elizabeth 01/04/2014, 11:34 AM

## 2014-01-04 NOTE — Transfer of Care (Signed)
Immediate Anesthesia Transfer of Care Note  Patient: [name]  Procedure(s) Performed: Procedure(s) with comments: PERCUTANEOUS PINNING RIGHT 4TH FINGER (Right) - REQUESTS SMALL CARM. SMALL BONE FIXATION SYSTEM. BIOMET AWARE  Patient Location: PACU  Anesthesia Type:General  Level of Consciousness: lethargic  Airway & Oxygen Therapy: Patient Spontanous Breathing and Patient connected to nasal cannula oxygen  Post-op Assessment: Report given to PACU RN and Post -op Vital signs reviewed and stable  Post vital signs: Reviewed and stable  Complications: No apparent anesthesia complications

## 2014-01-04 NOTE — Progress Notes (Signed)
Pt scheduled for orif R hand metacarpal fracture(s). I have re-examined and reviewed pt's condition and have explained procedure again to patient and pt's mother. All questions have been answered. I do not see any contraindications to surgery as planned and therefore will proceed with surgery.

## 2014-01-04 NOTE — Progress Notes (Signed)
Physical medicine rehabilitation consult requested. Await surgical intervention for multiple metacarpal fractures today and and then complete formal rehabilitation consult

## 2014-01-04 NOTE — Anesthesia Preprocedure Evaluation (Addendum)
Anesthesia Evaluation  Patient identified by MRN, date of birth, ID band Patient awake    Reviewed: Allergy & Precautions, H&P , NPO status , Patient's Chart, lab work & pertinent test results  Airway Mallampati: II  TM Distance: >3 FB Neck ROM: Full    Dental no notable dental hx. (+) Teeth Intact, Dental Advisory Given   Pulmonary neg pulmonary ROS,  breath sounds clear to auscultation  Pulmonary exam normal       Cardiovascular negative cardio ROS  Rhythm:Regular Rate:Normal     Neuro/Psych negative neurological ROS  negative psych ROS   GI/Hepatic negative GI ROS, Neg liver ROS,   Endo/Other  negative endocrine ROS  Renal/GU negative Renal ROS  negative genitourinary   Musculoskeletal   Abdominal   Peds  Hematology negative hematology ROS (+)   Anesthesia Other Findings   Reproductive/Obstetrics negative OB ROS                            Anesthesia Physical Anesthesia Plan  ASA: I  Anesthesia Plan: General   Post-op Pain Management:    Induction: Intravenous  Airway Management Planned: LMA  Additional Equipment:   Intra-op Plan:   Post-operative Plan: Extubation in OR  Informed Consent: I have reviewed the patients History and Physical, chart, labs and discussed the procedure including the risks, benefits and alternatives for the proposed anesthesia with the patient or authorized representative who has indicated his/her understanding and acceptance.   Dental advisory given  Plan Discussed with: CRNA  Anesthesia Plan Comments:         Anesthesia Quick Evaluation  
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
## 2014-01-04 NOTE — Progress Notes (Signed)
Patient is ready for OR later today.  [name], [name], MD, FACS [phone] Trauma Surgeon

## 2014-01-04 NOTE — Progress Notes (Signed)
Rehab Admissions Coordinator Note:  Patient was screened by Clois DupesBoyette, Barbara Godwin for appropriateness for an Inpatient Acute Rehab Consult per therapy recommendations.  At this time, we are recommending Inpatient Rehab consult.  Clois DupesBoyette, Barbara Godwin 01/04/2014, 7:42 AM  I can be reached at [phone].

## 2014-01-04 NOTE — Anesthesia Postprocedure Evaluation (Signed)
  Anesthesia Post-op Note  Patient: [name]  Procedure(s) Performed: Procedure(s) with comments: PERCUTANEOUS PINNING RIGHT 4TH FINGER (Right) - REQUESTS SMALL CARM. SMALL BONE FIXATION SYSTEM. BIOMET AWARE  Patient Location: PACU  Anesthesia Type:General  Level of Consciousness: awake, sedated and patient cooperative  Airway and Oxygen Therapy: Patient Spontanous Breathing  Post-op Pain: mild  Post-op Assessment: Post-op Vital signs reviewed, Patient's Cardiovascular Status Stable, Respiratory Function Stable, Patent Airway and No signs of Nausea or vomiting  Post-op Vital Signs: stable  Last Vitals:  Filed Vitals:   01/04/14 1600  BP: 133/78  Pulse: 90  Temp: 36.7 C  Resp: 13    Complications: No apparent anesthesia complications

## 2014-01-05 ENCOUNTER — Encounter (HOSPITAL_COMMUNITY): Payer: Self-pay | Admitting: General Surgery

## 2014-01-05 DIAGNOSIS — S069X9A Unspecified intracranial injury with loss of consciousness of unspecified duration, initial encounter: Secondary | ICD-10-CM

## 2014-01-05 DIAGNOSIS — S069XAA Unspecified intracranial injury with loss of consciousness status unknown, initial encounter: Secondary | ICD-10-CM

## 2014-01-05 DIAGNOSIS — S82109A Unspecified fracture of upper end of unspecified tibia, initial encounter for closed fracture: Secondary | ICD-10-CM

## 2014-01-05 DIAGNOSIS — S72409A Unspecified fracture of lower end of unspecified femur, initial encounter for closed fracture: Secondary | ICD-10-CM

## 2014-01-05 LAB — CBC
HCT: 28.6 % — ABNORMAL LOW (ref 39.0–52.0)
Hemoglobin: 9.5 g/dL — ABNORMAL LOW (ref 13.0–17.0)
MCH: 28.2 pg (ref 26.0–34.0)
MCHC: 33.2 g/dL (ref 30.0–36.0)
MCV: 84.9 fL (ref 78.0–100.0)
PLATELETS: 212 10*3/uL (ref 150–400)
RBC: 3.37 MIL/uL — ABNORMAL LOW (ref 4.22–5.81)
RDW: 13.4 % (ref 11.5–15.5)
WBC: 8.1 10*3/uL (ref 4.0–10.5)

## 2014-01-05 MED ORDER — MAGNESIUM CITRATE PO SOLN: 1.0000 | Freq: Once | ORAL | Status: DC

## 2014-01-05 MED ORDER — POLYETHYLENE GLYCOL 3350 17 G PO PACK
17.0000 g | PACK | Freq: Two times a day (BID) | ORAL | Status: DC
  Administered 2014-01-05: 17 g via ORAL
  Filled 2014-01-05 (×5): qty 1
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
## 2014-01-05 NOTE — Progress Notes (Signed)
Patient ID: [name], male   DOB: 10/23/1985, 28 y.o.   MRN: [id number]   LOS: 7 days   Subjective: No new c/o.   Objective: Vital signs in last 24 hours: Temp:  [98 F (36.7 C)-98.6 F (37 C)] 98.4 F (36.9 C) (06/25 0547) Pulse Rate:  [84-97] 88 (06/25 0547) Resp:  [10-16] 14 (06/25 0547) BP: (109-133)/(56-88) 118/60 mmHg (06/25 0547) SpO2:  [93 %-100 %] 93 % (06/25 0547) Last BM Date: 12/29/13   Laboratory  CBC  Recent Labs  01/04/14 0452 01/05/14 0340  WBC 9.6 8.1  HGB 9.9* 9.5*  HCT 29.6* 28.6*  PLT 192 212    Physical Exam General appearance: alert and no distress Resp: clear to auscultation bilaterally Cardio: regular rate and rhythm GI: normal findings: bowel sounds normal and soft, non-tender   Assessment[facility]Plan[facility]  TBI/SAH - Dr. Lovell SheehanJenkins noted no further workup needed, TBI team therapies  Nasal FX, R maxillary sinus FX - Dr. Pollyann Kennedyosen as OP if desired in 1 week  Pulmonary contusion  L elbow dislocation s/p CR - NWB, Dr. August Saucerean okay with elbow ROM 90* to full flexion  R 2,4,5 [facility] Fx s/p ORIF - NWB Grade 1 liver lac, abdominal wall abrasion - Hgb stable  R distal femur and patella FX/tibial plateau FX s/p ORIF femur - NWB, Blesdoe brace. Will need ligamentous repair later.  ABL anemia - Stable  FEN - Increase bowel regimen, stressed importance of taking mag citrate. Patient refusing suppository/enema at this time VTE -- SCD's, Lovenox  Dispo - D/C to CIR when bed available    Freeman CaldronMichael J. Jeffery, PA-C Pager: [phone] General Trauma PA Pager: [phone]  01/05/2014
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
## 2014-01-05 NOTE — Progress Notes (Signed)
S:  R Hand a little sore  O:Blood pressure 118/60, pulse 88, temperature 98.4 F (36.9 C), temperature source Oral, resp. rate 14, height 5\' 8"  (1.727 m), weight 73.9 kg (162 lb 14.7 oz), SpO2 93.00%.  Moves fingers, sensation intact, mild swelling, splint intact  A:s/p closed reduction/ ORIF R 4th metacarpal fx   P:nwb R hand, keep elevated, may move fingers, will need pin removal in ~3-4 wks

## 2014-01-05 NOTE — Progress Notes (Signed)
Encouraged mag citrate. I also spoke with his family. Hope for CIR soon. Patient examined and I agree with the assessment and plan  Violeta GelinasBurke Thompson, MD, MPH, FACS Trauma: [phone] General Surgery: [phone]  01/05/2014 9:39 AM

## 2014-01-05 NOTE — Progress Notes (Signed)
   Subjective:  Patient reports pain as moderate.  No events  Objective:   VITALS:   Filed Vitals:   01/04/14 1700 01/04/14 2105 01/05/14 0055 01/05/14 0547  BP: 109/62 117/59 112/56 118/60  Pulse: 91 97 91 88  Temp: 98 F (36.7 C) 98.4 F (36.9 C) 98.6 F (37 C) 98.4 F (36.9 C)  TempSrc:  Oral Oral Oral  Resp: 16 14 14 14   Height:      Weight:      SpO2: 99% 100% 93% 93%    Incisions c/d/i Traumatic wound no worse bledsoe brace    Lab Results  Component Value Date   WBC 8.1 01/05/2014   HGB 9.5* 01/05/2014   HCT 28.6* 01/05/2014   MCV 84.9 01/05/2014   PLT 212 01/05/2014     Assessment/Plan:  1 Day Post-Op   - dressings changed - start wet to dry dressings BID to traumatic wound - bledsoe brace  [name], [name] 01/05/2014, 7:59 AM [phone]

## 2014-01-05 NOTE — Progress Notes (Signed)
Rehab admissions - I met with pt, his mother and girlfriend in follow up to rehab MD consult. I explained the possibility of inpatient rehab and they are interested in pursuing this. Further questions were answered and informational brochures were given.  I will now open his case with [employer] [place] to seek insurance authorization. I will keep the pt/family and medical team aware of any updates as I wait for insurance authorization to proceed.  We will consider inpt rehab stay once we receive insurance authorization, pending pt's medical stability and our bed availability.  Please call me with any questions. Thanks.  Nanetta Batty, PT Rehabilitation Admissions Coordinator [phone]

## 2014-01-05 NOTE — Progress Notes (Signed)
Patient was given magnesium sulfate along with other bowel regiments,  he drank 2 sips and refused to drink any more of the medication.  He did take his miralax, colace and senna tablets.

## 2014-01-06 ENCOUNTER — Encounter (HOSPITAL_COMMUNITY): Payer: Self-pay | Admitting: *Deleted

## 2014-01-06 ENCOUNTER — Inpatient Hospital Stay (HOSPITAL_COMMUNITY)
Admission: RE | Admit: 2014-01-06 | Discharge: 2014-01-12 | DRG: 945 | Disposition: A | Discharge status: Home Health | Payer: BC Managed Care – PPO | Source: Intra-hospital | Attending: Physical Medicine & Rehabilitation | Admitting: Physical Medicine & Rehabilitation

## 2014-01-06 DIAGNOSIS — S36114A Minor laceration of liver, initial encounter: Secondary | ICD-10-CM | POA: Diagnosis present

## 2014-01-06 DIAGNOSIS — Z602 Problems related to living alone: Secondary | ICD-10-CM

## 2014-01-06 DIAGNOSIS — S53126A Posterior dislocation of unspecified ulnohumeral joint, initial encounter: Secondary | ICD-10-CM | POA: Diagnosis present

## 2014-01-06 DIAGNOSIS — S83509A Sprain of unspecified cruciate ligament of unspecified knee, initial encounter: Secondary | ICD-10-CM

## 2014-01-06 DIAGNOSIS — S82009A Unspecified fracture of unspecified patella, initial encounter for closed fracture: Secondary | ICD-10-CM | POA: Diagnosis present

## 2014-01-06 DIAGNOSIS — S0292XS Unspecified fracture of facial bones, sequela: Secondary | ICD-10-CM

## 2014-01-06 DIAGNOSIS — Y92410 Unspecified street and highway as the place of occurrence of the external cause: Secondary | ICD-10-CM

## 2014-01-06 DIAGNOSIS — S022XXA Fracture of nasal bones, initial encounter for closed fracture: Secondary | ICD-10-CM | POA: Diagnosis present

## 2014-01-06 DIAGNOSIS — D62 Acute posthemorrhagic anemia: Secondary | ICD-10-CM | POA: Diagnosis present

## 2014-01-06 DIAGNOSIS — S83512D Sprain of anterior cruciate ligament of left knee, subsequent encounter: Secondary | ICD-10-CM

## 2014-01-06 DIAGNOSIS — S069XAA Unspecified intracranial injury with loss of consciousness status unknown, initial encounter: Secondary | ICD-10-CM

## 2014-01-06 DIAGNOSIS — S06309A Unspecified focal traumatic brain injury with loss of consciousness of unspecified duration, initial encounter: Secondary | ICD-10-CM

## 2014-01-06 DIAGNOSIS — S72409A Unspecified fracture of lower end of unspecified femur, initial encounter for closed fracture: Secondary | ICD-10-CM

## 2014-01-06 DIAGNOSIS — S069X7S Unspecified intracranial injury with loss of consciousness of any duration with death due to brain injury prior to regaining consciousness, sequela: Secondary | ICD-10-CM

## 2014-01-06 DIAGNOSIS — S069X9A Unspecified intracranial injury with loss of consciousness of unspecified duration, initial encounter: Secondary | ICD-10-CM

## 2014-01-06 DIAGNOSIS — S72413A Displaced unspecified condyle fracture of lower end of unspecified femur, initial encounter for closed fracture: Secondary | ICD-10-CM | POA: Diagnosis present

## 2014-01-06 DIAGNOSIS — S62309A Unspecified fracture of unspecified metacarpal bone, initial encounter for closed fracture: Secondary | ICD-10-CM | POA: Diagnosis present

## 2014-01-06 DIAGNOSIS — S82109A Unspecified fracture of upper end of unspecified tibia, initial encounter for closed fracture: Secondary | ICD-10-CM | POA: Diagnosis present

## 2014-01-06 DIAGNOSIS — S27329A Contusion of lung, unspecified, initial encounter: Secondary | ICD-10-CM | POA: Diagnosis present

## 2014-01-06 DIAGNOSIS — Z79899 Other long term (current) drug therapy: Secondary | ICD-10-CM

## 2014-01-06 DIAGNOSIS — Z5189 Encounter for other specified aftercare: Principal | ICD-10-CM

## 2014-01-06 DIAGNOSIS — S02109A Fracture of base of skull, unspecified side, initial encounter for closed fracture: Secondary | ICD-10-CM | POA: Diagnosis present

## 2014-01-06 DIAGNOSIS — Z880 Allergy status to penicillin: Secondary | ICD-10-CM

## 2014-01-06 LAB — CREATININE, SERUM
Creatinine, Ser: 0.62 mg/dL (ref 0.50–1.35)
GFR calc Af Amer: >90 mL/min (ref >90)

## 2014-01-06 LAB — TYPE AND SCREEN
ABO/RH(D): O POS
ANTIBODY SCREEN: NEGATIVE
UNIT DIVISION: 0
UNIT DIVISION: 0
Unit division: 0
Unit division: 0

## 2014-01-06 LAB — CBC
HCT: 30.2 % — ABNORMAL LOW (ref 39.0–52.0)
HEMOGLOBIN: 10.4 g/dL — AB (ref 13.0–17.0)
MCH: 28.7 pg (ref 26.0–34.0)
MCHC: 34.4 g/dL (ref 30.0–36.0)
MCV: 83.4 fL (ref 78.0–100.0)
Platelets: 260 10*3/uL (ref 150–400)
RBC: 3.62 MIL/uL — AB (ref 4.22–5.81)
RDW: 12.9 % (ref 11.5–15.5)
WBC: 8.5 10*3/uL (ref 4.0–10.5)

## 2014-01-06 MED ORDER — POLYETHYLENE GLYCOL 3350 17 G PO PACK
17.0000 g | PACK | Freq: Two times a day (BID) | ORAL | Status: DC
  Administered 2014-01-07 – 2014-01-09 (×3): 17 g via ORAL
  Filled 2014-01-06 (×14): qty 1

## 2014-01-06 MED ORDER — ONDANSETRON HCL 4 MG/2ML IJ SOLN: 4.0000 mg | Freq: Four times a day (QID) | INTRAMUSCULAR | Status: DC | PRN

## 2014-01-06 MED ORDER — SORBITOL 70 % SOLN: 30.0000 mL | Freq: Every day | Status: DC | PRN

## 2014-01-06 MED ORDER — ACETAMINOPHEN 325 MG PO TABS: 325.0000 mg | ORAL_TABLET | ORAL | Status: DC | PRN

## 2014-01-06 MED ORDER — ENOXAPARIN SODIUM 40 MG/0.4ML ~~LOC~~ SOLN
40.0000 mg | SUBCUTANEOUS | Status: DC
  Administered 2014-01-07 – 2014-01-11 (×5): 40 mg via SUBCUTANEOUS
  Filled 2014-01-06 (×7): qty 0.4

## 2014-01-06 MED ORDER — ENOXAPARIN SODIUM 40 MG/0.4ML ~~LOC~~ SOLN: 40.0000 mg | SUBCUTANEOUS | Status: DC

## 2014-01-06 MED ORDER — OXYCODONE HCL 5 MG PO TABS
5.0000 mg | ORAL_TABLET | ORAL | Status: DC | PRN
  Administered 2014-01-07: 10 mg via ORAL
  Administered 2014-01-08: 5 mg via ORAL
  Administered 2014-01-08: 15 mg via ORAL
  Administered 2014-01-08 – 2014-01-09 (×2): 10 mg via ORAL
  Administered 2014-01-11: 15 mg via ORAL
  Filled 2014-01-06: qty 1
  Filled 2014-01-06: qty 2
  Filled 2014-01-06: qty 3
  Filled 2014-01-06: qty 2
  Filled 2014-01-06: qty 3
  Filled 2014-01-06: qty 2

## 2014-01-06 MED ORDER — SENNA 8.6 MG PO TABS
1.0000 | ORAL_TABLET | Freq: Two times a day (BID) | ORAL | Status: DC
  Administered 2014-01-06 – 2014-01-09 (×3): 8.6 mg via ORAL
  Filled 2014-01-06 (×13): qty 1

## 2014-01-06 MED ORDER — OXYCODONE HCL ER 10 MG PO T12A
20.0000 mg | EXTENDED_RELEASE_TABLET | Freq: Two times a day (BID) | ORAL | Status: DC
  Administered 2014-01-06 – 2014-01-10 (×8): 20 mg via ORAL
  Filled 2014-01-06 (×9): qty 2

## 2014-01-06 MED ORDER — ONDANSETRON HCL 4 MG PO TABS: 4.0000 mg | ORAL_TABLET | Freq: Four times a day (QID) | ORAL | Status: DC | PRN

## 2014-01-06 MED ORDER — DOCUSATE SODIUM 100 MG PO CAPS
200.0000 mg | ORAL_CAPSULE | Freq: Two times a day (BID) | ORAL | Status: DC
  Administered 2014-01-06 – 2014-01-09 (×7): 200 mg via ORAL
  Filled 2014-01-06 (×14): qty 2

## 2014-01-06 MED ORDER — POLYVINYL ALCOHOL 1.4 % OP SOLN
1.0000 [drp] | OPHTHALMIC | Status: DC | PRN
Start: 2014-01-06 — End: 2014-01-12
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
## 2014-01-06 NOTE — Clinical Social Work Note (Signed)
Clinical Social Worker continuing to follow patient and family for support and discharge planning needs.  Patient has received insurance authorization and has a bed available for transfer to inpatient rehab today.  Clinical Social Worker will sign off for now as social work intervention is no longer needed. Please consult us again if new need arises.  Macario GoldsJesse Scinto, Kentucky[place]CS[place] [phone]

## 2014-01-06 NOTE — Progress Notes (Signed)
Patient and patient's mother request all 4 siderails on the bed be up.  Explained to the patient and patient's mother that 4 siderails are a restraint.  Patient and mother are aware and would still like all 4 siderails up.  Will continue to monitor.
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
## 2014-01-06 NOTE — Progress Notes (Signed)
Rehab admissions - I completed admission paperwork with pt and his mother. Girlfriend, dtr and other friend also present. Pt will be admitted to CIR later today. See my previous note from earlier today.  Thanks.  Juliann MuleJanine Turnington, PT Rehabilitation Admissions Coordinator [phone]

## 2014-01-06 NOTE — Progress Notes (Signed)
Rehab admissions - I received insurance authorization for inpatient rehab from [employer] and bed is available today. I received [facility] trauma PA to bring pt today and will admit pt later today to CIR. I updated pt and then called his mother to share the update. Mother is planning on coming later this afternoon to complete admission paperwork with me.  I updated Marcelino DusterMichelle, trauma case Production designer, theatre/television/filmmanager and Verdon CumminsJesse with social work as well. Rn also aware.  Please call me with any questions. Thanks.  Juliann MuleJanine Turnington, PT Rehabilitation Admissions Coordinator [phone]
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
## 2014-01-07 ENCOUNTER — Inpatient Hospital Stay (HOSPITAL_COMMUNITY): Payer: BC Managed Care – PPO | Admitting: Physical Therapy

## 2014-01-07 ENCOUNTER — Inpatient Hospital Stay (HOSPITAL_COMMUNITY): Payer: BC Managed Care – PPO | Admitting: Speech Pathology

## 2014-01-07 ENCOUNTER — Inpatient Hospital Stay (HOSPITAL_COMMUNITY): Payer: BC Managed Care – PPO | Admitting: Occupational Therapy

## 2014-01-07 DIAGNOSIS — S0292XS Unspecified fracture of facial bones, sequela: Secondary | ICD-10-CM

## 2014-01-07 DIAGNOSIS — S069XAS Unspecified intracranial injury with loss of consciousness status unknown, sequela: Secondary | ICD-10-CM

## 2014-01-07 DIAGNOSIS — M7989 Other specified soft tissue disorders: Secondary | ICD-10-CM

## 2014-01-07 DIAGNOSIS — S0291XS Unspecified fracture of skull, sequela: Secondary | ICD-10-CM

## 2014-01-07 DIAGNOSIS — S069X9S Unspecified intracranial injury with loss of consciousness of unspecified duration, sequela: Secondary | ICD-10-CM
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
## 2014-01-07 NOTE — Progress Notes (Signed)
Physical Therapy Session Note  Patient Details  Name: [name] MRN: [id number] Date of Birth: [birth year]  Today's Date: 01/07/2014 Time: 1300-1330 Time Calculation (min): 30 min  Short Term Goals: Week 1:  PT Short Term Goal 1 (Week 1): Pt will perform bed mobility with min A.  PT Short Term Goal 2 (Week 1): Pt will perform bed <> w/c transfers with mod A x 1. PT Short Term Goal 3 (Week 1): Pt will propel w/c using LLE x 150 ft with supervision.  PT Short Term Goal 4 (Week 1): Pt will maintain weightbearing precautions with functional mobility requiring 25% cuing.   Skilled Therapeutic Interventions/Progress Updates:    Pt with increased difficulty propelling wheelchair in pm session secondary to new cushion making it difficult to get heel on ground. Pt continues to demonstrate difficulty maintaining NWB status in extremities during functional mobility requiring frequent cues. Pt would continue to benefit from skilled PT services to increase functional mobility.  Therapy Documentation Precautions:  Precautions Precautions: Fall Required Braces or Orthoses: Other Brace/Splint Knee Immobilizer - Right: On at all times Other Brace/Splint: RLE bledsoe brace; R arm wrist/forearm splint with ACE wrap over Restrictions Weight Bearing Restrictions: Yes RUE Weight Bearing: Weight bearing as tolerated (through elbow; NWB wrist/forearm) LUE Weight Bearing: Non weight bearing RLE Weight Bearing: Non weight bearing Other Position/Activity Restrictions: WBAT LLE Pain: Pain Assessment Pain Assessment: 0-10 Pain Score: 7  Pain Type: Acute pain;Surgical pain Pain Location: Shoulder, RLE Pain Orientation: Right;Left Pain Onset: On-going Intervention: Positioning Mobility:   Locomotion : Wheelchair Mobility Distance: 200   Trunk/Postural Assessment : Cervical Assessment Cervical Assessment: Within Functional Limits Thoracic Assessment Thoracic Assessment: Exceptions to [facility]  (rounded shoulders) Lumbar Assessment Lumbar Assessment: Exceptions to [facility]FL (posterior pelvic tilt) Postural Control Postural Control: Deficits on evaluation (unable to maintain neutral spine)     Other Treatments:  Pt performs RLE hip flexion in sitting, L hip marching, anterior weight shifts, partial sit to stands x10. Pt educated on positioning, need for OOB, and progressive mobility.  See FIM for current functional status  Therapy/Group: Individual Therapy  Christia ReadingKinney, Steven G 01/07/2014, 4:01 PM
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
## 2014-01-07 NOTE — Progress Notes (Signed)
Bilateral lower extremity venous duplex:  No evidence of DVT, superficial thrombosis, or Baker's cyst.   

## 2014-01-08 DIAGNOSIS — Z5189 Encounter for other specified aftercare: Secondary | ICD-10-CM

## 2014-01-08 DIAGNOSIS — D62 Acute posthemorrhagic anemia: Secondary | ICD-10-CM

## 2014-01-08 DIAGNOSIS — S83509A Sprain of unspecified cruciate ligament of unspecified knee, initial encounter: Secondary | ICD-10-CM
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
## 2014-01-09 ENCOUNTER — Encounter (HOSPITAL_COMMUNITY): Payer: BC Managed Care – PPO

## 2014-01-09 ENCOUNTER — Inpatient Hospital Stay (HOSPITAL_COMMUNITY): Payer: BC Managed Care – PPO | Admitting: Physical Therapy

## 2014-01-09 ENCOUNTER — Inpatient Hospital Stay (HOSPITAL_COMMUNITY): Payer: BC Managed Care – PPO

## 2014-01-09 ENCOUNTER — Inpatient Hospital Stay (HOSPITAL_COMMUNITY): Payer: BC Managed Care – PPO | Admitting: *Deleted

## 2014-01-09 ENCOUNTER — Inpatient Hospital Stay (HOSPITAL_COMMUNITY): Payer: BC Managed Care – PPO | Admitting: Speech Pathology

## 2014-01-09 DIAGNOSIS — S82109A Unspecified fracture of upper end of unspecified tibia, initial encounter for closed fracture: Secondary | ICD-10-CM

## 2014-01-09 DIAGNOSIS — S72009A Fracture of unspecified part of neck of unspecified femur, initial encounter for closed fracture: Secondary | ICD-10-CM

## 2014-01-09 LAB — COMPREHENSIVE METABOLIC PANEL
ALBUMIN: 2.7 g/dL — AB (ref 3.5–5.2)
ALT: 200 U/L — AB (ref 0–53)
AST: 90 U/L — ABNORMAL HIGH (ref 0–37)
Alkaline Phosphatase: 138 U/L — ABNORMAL HIGH (ref 39–117)
BILIRUBIN TOTAL: 1.6 mg/dL — AB (ref 0.3–1.2)
BUN: 11 mg/dL (ref 6–23)
CHLORIDE: 92 meq/L — AB (ref 96–112)
CO2: 28 mEq/L (ref 19–32)
Calcium: 8.7 mg/dL (ref 8.4–10.5)
Creatinine, Ser: 0.71 mg/dL (ref 0.50–1.35)
GFR calc Af Amer: >90 mL/min (ref >90)
GFR calc non Af Amer: >90 mL/min (ref >90)
GLUCOSE: 108 mg/dL — AB (ref 70–99)
Potassium: 4.3 mEq/L (ref 3.7–5.3)
SODIUM: 132 meq/L — AB (ref 137–147)
TOTAL PROTEIN: 6.9 g/dL (ref 6.0–8.3)

## 2014-01-09 LAB — CBC WITH DIFFERENTIAL/PLATELET
Basophils Absolute: 0 10*3/uL (ref 0.0–0.1)
Basophils Relative: 0 % (ref 0–1)
EOS ABS: 0.2 10*3/uL (ref 0.0–0.7)
Eosinophils Relative: 3 % (ref 0–5)
HCT: 30.4 % — ABNORMAL LOW (ref 39.0–52.0)
HEMOGLOBIN: 10.2 g/dL — AB (ref 13.0–17.0)
Lymphocytes Relative: 25 % (ref 12–46)
Lymphs Abs: 1.9 10*3/uL (ref 0.7–4.0)
MCH: 28.4 pg (ref 26.0–34.0)
MCHC: 33.6 g/dL (ref 30.0–36.0)
MCV: 84.7 fL (ref 78.0–100.0)
Monocytes Absolute: 0.9 10*3/uL (ref 0.1–1.0)
Monocytes Relative: 11 % (ref 3–12)
NEUTROS PCT: 61 % (ref 43–77)
Neutro Abs: 4.6 10*3/uL (ref 1.7–7.7)
PLATELETS: 369 10*3/uL (ref 150–400)
RBC: 3.59 MIL/uL — ABNORMAL LOW (ref 4.22–5.81)
RDW: 13.1 % (ref 11.5–15.5)
WBC: 7.5 10*3/uL (ref 4.0–10.5)

## 2014-01-09 NOTE — Progress Notes (Signed)
Speech Language Pathology Daily Session Note  Patient Details  Name: [name] MRN: [id number] Date of Birth: 12/30/1985  Today's Date: 01/09/2014 Time: 1300-1400 Time Calculation (min): 60 min  Short Term Goals: Week 1: SLP Short Term Goal 1 (Week 1): Pt will utilzie external aides to increase recall of new information with min A SLP Short Term Goal 2 (Week 1): Pt will solve complex problem solving tasks with 80% accuracy, min-mod A SLP Short Term Goal 3 (Week 1): Pt will follow 3-step commands with 80% accuracy, min A SLP Short Term Goal 4 (Week 1): Pt will increase experssive communication with <3 word finding deficits noted at conversation level.   Skilled Therapeutic Interventions: Skilled treatment session focused on addressing cognition goals.  SLP facilitated session with moderately complex daily math calculations as well as complex medication management task, which patient completed with Mod I.  Patient required Supervision cues to anticipate how to track frequency of pain medications following discharge from hospital.  SLP also facilitated session with a new learning task and Supervision cues to utilize working memory strategies for procedures of task.  Continue plan of care.   FIM:  Comprehension Comprehension Mode: Auditory Comprehension: 5-Follows basic conversation/direction: With no assist Expression Expression Mode: Verbal Expression: 5-Expresses basic needs/ideas: With no assist Social Interaction Social Interaction: 6-Interacts appropriately with others with medication or extra time (anti-anxiety, antidepressant). Problem Solving Problem Solving: 5-Solves complex 90% of the time/cues < 10% of the time Memory Memory: 5-Recognizes or recalls 90% of the time/requires cueing < 10% of the time  Pain Pain Assessment Pain Assessment: No/denies pain  Therapy/Group: Individual Therapy  Charlane FerrettiMelissa Bowie, M.A., CCC-SLP [phone]  BOWIE,MELISSA 01/09/2014, 4:23  PM

## 2014-01-09 NOTE — Progress Notes (Signed)
Patient information reviewed and entered into eRehab system by Marie Noel, RN, CRRN, PPS Coordinator.  Information including medical coding and functional independence measure will be reviewed and updated through discharge.    

## 2014-01-09 NOTE — Progress Notes (Signed)
Physical Therapy Session Note  Patient Details  Name: [name] MRN: [id number] Date of Birth: 1985-12-14  Today's Date: 01/09/2014 Time: 0803-0902 and 1006-1030 Time Calculation (min): 59 min and 24 min  Short Term Goals: Week 1:  PT Short Term Goal 1 (Week 1): Pt will perform bed mobility with min A.  PT Short Term Goal 2 (Week 1): Pt will perform bed <> w/c transfers with mod A x 1. PT Short Term Goal 3 (Week 1): Pt will propel w/c using LLE x 150 ft with supervision.  PT Short Term Goal 4 (Week 1): Pt will maintain weightbearing precautions with functional mobility requiring 25% cuing.   Skilled Therapeutic Interventions/Progress Updates:    First session: Patient received supine in bed. Session focused on knowledge/implementation of precautions, functional transfers, and R LE strengthening. Patient able to state all WB precautions without cues as well as implement ~90% of session with exception of bed mobility or static sitting, patient attempting to push through L UE. Patient requires supervision/cues for sequencing for supine>sit, minA for sit>supine for R LE management. MinA overall for stand pivot transfers, cues for slower pace as patient tends to rush and have LOB. Patient performed x10 sit<>stand with L LE only and min guard progressing to close supervision, one significant LOB to the R secondary to poor L foot placement.  In supine, ankle pumps x30, L LE bridges, R LE SLR, and R LE hip abd x10, education about importance of ankle pumps. Patient returned to room and left supine in bed with all needs within reach.  Second session: Patient received supine in bed. Session focused on bed mobility and functional transfers via various methods. Patient transferred bed>wheelchair via stand pivot and minA, continues to demonstrate small LOB due to SLS. Introduced use of slideboard, patient requires set up assistance for placement of slideboard, transfers wheelchair<>bed several times  to R and L, initially requires min guard, progressing to supervision. Patient supervision for sit<>supine. Patient left supine in bed with all needs within reach and bed alarm on.  Therapy Documentation Precautions:  Precautions Precautions: Fall Required Braces or Orthoses: Other Brace/Splint Knee Immobilizer - Right: On at all times Other Brace/Splint: RLE bledsoe brace; right short arm splint  Restrictions Weight Bearing Restrictions: Yes RUE Weight Bearing: Weight bear through elbow only LUE Weight Bearing: Non weight bearing RLE Weight Bearing: Non weight bearing Other Position/Activity Restrictions: WBAT LLE Pain: Pain Assessment Pain Assessment: No/denies pain Pain Score: 0-No pain Locomotion : Ambulation Ambulation/Gait Assistance: Not tested (comment) Wheelchair Mobility Distance: 200   See FIM for current functional status  Therapy/Group: Individual Therapy  Chipper HerbBridget S Chavez [name], PT, DPT 01/09/2014, 9:35 AM
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
## 2014-01-09 NOTE — Progress Notes (Signed)
Occupational Therapy Session Note  Patient Details  Name: [name] MRN: [id number] Date of Birth: [birth year]  Today's Date: 01/09/2014 Time: 1430-1500 Time Calculation (min): 30 min  Short Term Goals: Week 1:  OT Short Term Goal 1 (Week 1): Patient will groom at the sink with setup. OT Short Term Goal 2 (Week 1): Patient will complete LB dressing with AE with minimum assistance. OT Short Term Goal 3 (Week 1): Patient will complete wheelchair to/from toilet transfer with minimum assistance. OT Short Term Goal 4 (Week 1): Patient will complete toileting with moderate assistance. OT Short Term Goal 5 (Week 1): Patient will complete bathing with setup with use of AE.  Skilled Therapeutic Interventions/Progress Updates: ADL-re-training with focus on transfers, maintaining WB and ROM restrictions, and safety awareness.  Patient received in w/c in his room with physical therapist attending at end of their session.   Patient was challenged to demonstrate his ability to complete transfer on/off BSC.  Pt reports that his mother has a bedside commode which he plans to use as needed while residing with her temporarily.   OT escorted patient to ADL apartment to provide space for transfer/assessment.   Patient completed stand-pivot transfer with setup assist to lock left brake of w/c and verbal cues for safety as patient performed transfer.   Patient was returned to his room and completed similar transfer to bed with assist only for safety and verbal cue to avoid elbow flexion at L-UE greater than 90 deg.  Patient is left hand dominant but cannot generate sufficient force to lock brake (advised on need for brake lever extension to allow reach with right hand).    Therapy Documentation Precautions:  Precautions Precautions: Fall Required Braces or Orthoses: Other Brace/Splint Knee Immobilizer - Right: On at all times Other Brace/Splint: RLE bledsoe brace; right short arm splint   Restrictions Weight Bearing Restrictions: Yes RUE Weight Bearing: Weight bear through elbow only LUE Weight Bearing: Non weight bearing RLE Weight Bearing: Non weight bearing Other Position/Activity Restrictions: WBAT LLE  Pain: Pain Assessment Pain Assessment: No/denies pain  See FIM for current functional status  Therapy/Group: Individual Therapy  BARTHOLD,FRANK 01/09/2014, 3:00 PM
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
## 2014-01-09 NOTE — Progress Notes (Signed)
Occupational Therapy Session Note  Patient Details  Name: [name] MRN: [id number] Date of Birth: [birth year]  Today's Date: 01/09/2014 Time: 1100-1156 Time Calculation (min): 56 min  Short Term Goals: Week 1:  OT Short Term Goal 1 (Week 1): Patient will groom at the sink with setup. OT Short Term Goal 2 (Week 1): Patient will complete LB dressing with AE with minimum assistance. OT Short Term Goal 3 (Week 1): Patient will complete wheelchair to/from toilet transfer with minimum assistance. OT Short Term Goal 4 (Week 1): Patient will complete toileting with moderate assistance. OT Short Term Goal 5 (Week 1): Patient will complete bathing with setup with use of AE.  Skilled Therapeutic Interventions/Progress Updates:    Pt resting in bed upon arrival.  Pt requested to bathe/dress at bed level this morning secondary to fatigue.  Pt required assistance with bathing Rt foot and donning right sock.  Pt used reacher to assist with LB dressing.  Pt recalled all weight bearing precautions.  Focus on activity tolerance, safety awareness, bed mobility, and discharge planning.  Therapy Documentation Precautions:  Precautions Precautions: Fall Required Braces or Orthoses: Other Brace/Splint Knee Immobilizer - Right: On at all times Other Brace/Splint: RLE bledsoe brace; right short arm splint  Restrictions Weight Bearing Restrictions: Yes RUE Weight Bearing: Weight bear through elbow only LUE Weight Bearing: Non weight bearing RLE Weight Bearing: Non weight bearing Other Position/Activity Restrictions: WBAT LLE   Pain: Pain Assessment Pain Assessment: No/denies pain Pain Score: 0-No pain  See FIM for current functional status  Therapy/Group: Individual Therapy  Rich BraveLanier, Thomas Chappell 01/09/2014, 12:13 PM

## 2014-01-09 NOTE — Progress Notes (Signed)
Physical Therapy Session Note  Patient Details  Name: [name] MRN: [id number] Date of Birth: Jun 25, 1986  Today's Date: 01/09/2014 Time: [id number] Time Calculation (min): 30 min  Short Term Goals: Week 1:  PT Short Term Goal 1 (Week 1): Pt will perform bed mobility with min A.  PT Short Term Goal 2 (Week 1): Pt will perform bed <> w/c transfers with mod A x 1. PT Short Term Goal 3 (Week 1): Pt will propel w/c using LLE x 150 ft with supervision.  PT Short Term Goal 4 (Week 1): Pt will maintain weightbearing precautions with functional mobility requiring 25% cuing.   Skilled Therapeutic Interventions/Progress Updates:   Focus on furniture transfers and slideboard transfer technique. Pt received supine in bed, reporting fatigue but agreeable to therapy. Pt able to recall all WB precautions independently. Supine <> [facility] and no rail and bed in ADL apartment, supervision. Slideboard transfers to R and L from hospital bed > w/c, w/c <> couch, w/c <> standard bed in ADL apartment with setup assist/supervision, therapist stabilizing slideboard. Pt requires assist with w/c parts management due to UE precautions. W/c propulsion with cushion removed using LLE only x 150 ft, supervision. Pt performed sit without UE support from low couch x 5 with supervision, 1 minor LOB resulting in TDWB through RLE. Pt returned to room and left sitting in w/c, handoff to OT.   Therapy Documentation Precautions:  Precautions Precautions: Fall Required Braces or Orthoses: Other Brace/Splint Knee Immobilizer - Right: On at all times Other Brace/Splint: RLE bledsoe brace; right short arm splint  Restrictions Weight Bearing Restrictions: Yes RUE Weight Bearing: Weight bear through elbow only LUE Weight Bearing: Non weight bearing RLE Weight Bearing: Non weight bearing Other Position/Activity Restrictions: WBAT LLE Pain: Pain Assessment Pain Assessment: No/denies pain  See  FIM for current functional status  Therapy/Group: Individual Therapy  Kerney ElbeVarner, Rebecca A 01/09/2014, 3:28 PM

## 2014-01-10 ENCOUNTER — Inpatient Hospital Stay (HOSPITAL_COMMUNITY): Payer: BC Managed Care – PPO | Admitting: *Deleted

## 2014-01-10 ENCOUNTER — Inpatient Hospital Stay (HOSPITAL_COMMUNITY): Payer: BC Managed Care – PPO | Admitting: Speech Pathology

## 2014-01-10 ENCOUNTER — Encounter (HOSPITAL_COMMUNITY): Payer: BC Managed Care – PPO

## 2014-01-10 ENCOUNTER — Inpatient Hospital Stay (HOSPITAL_COMMUNITY): Payer: BC Managed Care – PPO

## 2014-01-10 DIAGNOSIS — S72009A Fracture of unspecified part of neck of unspecified femur, initial encounter for closed fracture: Secondary | ICD-10-CM

## 2014-01-10 DIAGNOSIS — S82109A Unspecified fracture of upper end of unspecified tibia, initial encounter for closed fracture: Secondary | ICD-10-CM

## 2014-01-10 MED ORDER — OXYCODONE HCL ER 15 MG PO T12A
15.0000 mg | EXTENDED_RELEASE_TABLET | Freq: Two times a day (BID) | ORAL | Status: DC
  Administered 2014-01-10 – 2014-01-12 (×4): 15 mg via ORAL
  Filled 2014-01-10 (×4): qty 1

## 2014-01-10 NOTE — Plan of Care (Signed)
Problem: RH Car Transfers Goal: LTG Patient will perform car transfers with assist (PT) LTG: Patient will perform car transfers with assistance (PT).  upgraded 01/10/14 secondary to patient progress  Problem: RH Wheelchair Mobility Goal: LTG Patient will propel w/c in community environment (PT) LTG: Patient will propel wheelchair in community environment, # of feet with assist (PT)  upgraded 01/10/14 secondary to patient progress

## 2014-01-10 NOTE — Progress Notes (Signed)
Occupational Therapy Session Note  Patient Details  Name: [name] MRN: [id number] Date of Birth: 09-06-1985  Today's Date: 01/10/2014 Time: 0700-0755 and 1345-1430 Time Calculation (min): 55 min and 45 min   Short Term Goals: Week 1:  OT Short Term Goal 1 (Week 1): Patient will groom at the sink with setup. OT Short Term Goal 2 (Week 1): Patient will complete LB dressing with AE with minimum assistance. OT Short Term Goal 3 (Week 1): Patient will complete wheelchair to/from toilet transfer with minimum assistance. OT Short Term Goal 4 (Week 1): Patient will complete toileting with moderate assistance. OT Short Term Goal 5 (Week 1): Patient will complete bathing with setup with use of AE.  Skilled Therapeutic Interventions/Progress Updates:    Session 1: Pt seen for ADL retraining with focus on adherence to precautions, functional transfers, activity tolerance, and use of AE. Pt received supine in bed and verbalized all precautions. Completed bathing sitting EOB at supervision level for sitting balance and min assist for washing RLE for thoroughness. Utilized reacher and sock-aid for LB dressing with min cues and min assist for donning sock on RLE. Completed functional tranfers with sliding board bed<>w/c and apartment bed<>w/c with supervision and pt directing care with min cues for positioning of w/c. Pt required assist for positioning of sliding board. Completed stand pivot tranfers w/c<>toilet at supervision level and min cues for safety. Discussed home setup and DME. Pt reporting mother has BSC. Pt required min cues for adherence to precautions throughout therapy session. Pt left sitting EOB with all needs in reach.   Session 2: Pt seen for 1:1 OT session with focus on functional transfers, family education, and activity tolerance. Pt received sitting in w/c with mother present. Practiced functional transfers bed<>w/c and w/c<>couch using sliding board at supervision level. Pt's  mother initially required min cues for setup of sliding board then able to return demonstration without cues with repetition. Completed stand pivot transfer w/c<>BSC with RW to guide RW without weightbearing. Pt managed clothing while sitting in w/c prior to transfer without difficulty. Mother safely returned demonstration. Pt demonstrating improved memory as he was able to direct care to mother throughout session. Mother reporting pt will not be doing any cooking at home. Practiced simulated walk-in shower transfer using sliding board and squat pivot with steadying-supervision assist. Pt propelled self in w/c to room without rest breaks. Pt returned to bed and left with all needs in reach. No questions from mother or patient at this time.  Therapy Documentation Precautions:  Precautions Precautions: Fall Required Braces or Orthoses: Other Brace/Splint Knee Immobilizer - Right: On at all times Other Brace/Splint: RLE bledsoe brace; right short arm splint  Restrictions Weight Bearing Restrictions: Yes RUE Weight Bearing: Weight bear through elbow only LUE Weight Bearing: Non weight bearing RLE Weight Bearing: Non weight bearing Other Position/Activity Restrictions: WBAT LLE General:   Vital Signs: Therapy Vitals Temp: 97.3 F (36.3 C) Temp src: Oral Pulse Rate: 90 Resp: 18 BP: 108/68 mmHg Patient Position (if appropriate): Lying Oxygen Therapy SpO2: 97 % O2 Device: None (Room air) Pain: No report of pain during therapy session.  See FIM for current functional status  Therapy/Group: Individual Therapy  Daneil Danerkinson, Kayla N 01/10/2014, 8:43 AM
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
## 2014-01-10 NOTE — Progress Notes (Signed)
Speech Language Pathology Daily Session Note  Patient Details  Name: [name] MRN: [id number] Date of Birth: 1985/10/23  Today's Date: 01/10/2014 Time: 1100-1155 Time Calculation (min): 55 min  Short Term Goals: Week 1: SLP Short Term Goal 1 (Week 1): Pt will utilzie external aides to increase recall of new information with min A SLP Short Term Goal 2 (Week 1): Pt will solve complex problem solving tasks with 80% accuracy, min-mod A SLP Short Term Goal 3 (Week 1): Pt will follow 3-step commands with 80% accuracy, min A SLP Short Term Goal 4 (Week 1): Pt will increase experssive communication with <3 word finding deficits noted at conversation level.   Skilled Therapeutic Interventions: Skilled treatment session focused on cognitive goals. SLP facilitated the session by administering the [facility]) with the written expression portion of the assessment eliminated due to weight bearing precautions.  Pt scored a 20/25 with impairments in recall of new information and working memory.  Pt's mother present and was educated on memory strategies to increase recall of new information and facilitate safety at home. Pt's mother verbalized understanding and a handout was given to reinforce information. Pt also recalled rules to a previously learned task and demonstrated appropriate problem solving with Mod I. Continue with current plan of care.    FIM:  Comprehension Comprehension Mode: Auditory Comprehension: 6-Follows complex conversation/direction: With extra time/assistive device Expression Expression Mode: Verbal Expression: 6-Expresses complex ideas: With extra time/assistive device Problem Solving Problem Solving: 5-Solves basic 90% of the time/requires cueing < 10% of the time Memory Memory: 5-Recognizes or recalls 90% of the time/requires cueing < 10% of the time FIM - Eating Eating Activity: 5: Supervision/cues  Pain No/Denies Pain  Therapy/Group:  Individual Therapy  Jermesha Sottile 01/10/2014, 4:28 PM

## 2014-01-10 NOTE — Progress Notes (Signed)
Physical Therapy Session Note  Patient Details  Name: Alexander CuriaSinclair Furey MRN: [id number] Date of Birth: 1985/09/27  Today's Date: 01/10/2014 Time: [id number] and [id number] Time Calculation (min): 58 min and 42 min  Short Term Goals: Week 1:  PT Short Term Goal 1 (Week 1): STGs modified secondary to short LOS; STGs=LTGs PT Short Term Goal 2 (Week 1): Pt will perform bed <> w/c transfers with mod A x 1. PT Short Term Goal 3 (Week 1): Pt will propel w/c using LLE x 150 ft with supervision.  PT Short Term Goal 4 (Week 1): Pt will maintain weightbearing precautions with functional mobility requiring 25% cuing.   Skilled Therapeutic Interventions/Progress Updates:    AM Session: Patient received supine in bed, girlfriend present. Session focused on administration of the GOAT [facility]([place] Orientation & Amnesia Test), functional transfers (emphasis on incline/decline transfers with slide board), wheelchair mobility with ramp negotiation, car transfers, and LE strengthening. Patient requires supervision/set up assistance overall for all bed<>wheelchair, mat<>wheelchair transfers, and car (of low height)<>wheelchair transfers; patient requires set up for positioning of wheelchair, locking brakes, removing arm rests/legrests, and placement of slide baord secondary to weight bearing precautions of B UE. Patient mod I with bed mobility and wheelchair mobility (in controlled and home environments as well as carpet). Patient negotiated ramp in wheelchair, ascending backwards and descending forwards with use of L LE for propulsion. Patient performed stand pivot transfer wheelchair>toilet and left sitting on toilet with girlfriend present for supervision, instructed to pull string when done; nurse tech aware.  GOAT: 98/100 (76-100 = Normal)  PM Session: Patient received supine in bed, mother present for family education/training. Session focused on family education/training with patient's mother and patient's  ability to direct care with functional transfers and car transfers. Demonstration performed for bed<>wheelchair transfers with slideboard and patient's mother return demonstrated supervision/set up assistance overall for all bed<>wheelchair, mat<>wheelchair transfers, and car (of low height)<>wheelchair transfers with mod cues progressing to min cues; patient's mother would benefit from additional education. Patient requires set up for positioning of wheelchair, locking brakes, removing arm rests/legrests, and placement of slide baord secondary to weight bearing precautions of B UE.   Therapy Documentation Precautions:  Precautions Precautions: Fall Required Braces or Orthoses: Other Brace/Splint Knee Immobilizer - Right: On at all times Other Brace/Splint: RLE bledsoe brace; right short arm splint  Restrictions Weight Bearing Restrictions: Yes RUE Weight Bearing: Weight bear through elbow only LUE Weight Bearing: Non weight bearing RLE Weight Bearing: Non weight bearing Other Position/Activity Restrictions: WBAT LLE Pain: Pain Assessment Pain Assessment: 0-10 Pain Score: 8  Pain Type: Acute pain Pain Location: Knee Pain Orientation: Right Pain Descriptors / Indicators: Aching;Sore Pain Onset: With Activity Pain Intervention(s): RN made aware;Repositioned;Ambulation/increased activity Multiple Pain Sites: No Locomotion : Ambulation Ambulation/Gait Assistance: Not tested (comment) Wheelchair Mobility Distance: 200   See FIM for current functional status  Therapy/Group: Individual Therapy  Chipper HerbBridget S Zalaya Astarita S. Kissy Cielo, PT, DPT 01/10/2014, 10:30 AM

## 2014-01-11 ENCOUNTER — Inpatient Hospital Stay (HOSPITAL_COMMUNITY): Payer: BC Managed Care – PPO | Admitting: *Deleted

## 2014-01-11 ENCOUNTER — Inpatient Hospital Stay (HOSPITAL_COMMUNITY): Payer: BC Managed Care – PPO | Admitting: Speech Pathology

## 2014-01-11 ENCOUNTER — Encounter (HOSPITAL_COMMUNITY): Payer: BC Managed Care – PPO

## 2014-01-11 ENCOUNTER — Inpatient Hospital Stay (HOSPITAL_COMMUNITY): Payer: BC Managed Care – PPO

## 2014-01-11 MED ORDER — OXYCODONE HCL ER 15 MG PO T12A: 15.0000 mg | EXTENDED_RELEASE_TABLET | Freq: Two times a day (BID) | ORAL | Status: DC

## 2014-01-11 MED ORDER — OXYCODONE HCL 5 MG PO TABS: 5.0000 mg | ORAL_TABLET | ORAL | Status: DC | PRN

## 2014-01-11 MED ORDER — DSS 100 MG PO CAPS: 200.0000 mg | ORAL_CAPSULE | Freq: Two times a day (BID) | ORAL | Status: DC

## 2014-01-11 NOTE — Progress Notes (Signed)
Occupational Therapy Discharge Summary  Patient Details  Name: [name] MRN: [id number] Date of Birth: 1985/08/11  Today's Date: 01/11/2014 Time: 5364-6803 and 2122-4825 Time Calculation (min): 40 min and 25 min   Patient has met 9 of 9 long term goals due to improved activity tolerance, improved balance, postural control, ability to compensate for deficits, improved attention, improved awareness and improved coordination.  Patient to discharge at overall Supervision level.  Patient's care partner is independent to provide the necessary physical and cognitive assistance at discharge. Patient is discharging home with his mother who has participated in 2 family education/training sessions. Patient demonstrates the ability to direct care to mother with mother returning ability to provide supervision/setup level for functional transfers and BADLs. Patient and mother with no questions or concerns at this time.  Reasons goals not met: N/A. All LTGs met  Recommendation:  No recommendations for skilled OT at this time. Patient would benefit from skilled OT evaluation following removal of weight bearing restrictions and braces.   Equipment: No equipment provided  Reasons for discharge: treatment goals met and discharge from hospital  Patient/family agrees with progress made and goals achieved: Yes  Skilled Therapeutic Intervention Session 1: Pt seen for ADL retraining with focus on adherence to precautions, use of AE, functional transfers, activity tolerance, and family education/training. Pt received supine in bed with mother [name]) present. Completed bathing and dressing supine>EOB with setup assist and use of reacher, LH sponge, and sock-aid. Pt verbalizing all precautions at beginning of session requiring min cues for adherence. Completed squat pivot transfer w/c<>BSC at supervision level and pt directing care. Discussed home setup with mother at home and recommendations with use of BSC  at this time to ensure proper positioning of w/c. Mother reporting pt's bed is elevated however unable to determine actual height. Completed sliding board transfers w/c<>elevated bed with min cues for first several attempts and pt directing care as well. With repetition, mother and son completed transfer 2x at supervision level. Mother asking questions in regards to nursing care which therapist deferred to RN. Mother and pt with no questions at this time. Pt left supine in bed as he reported increase in fatigue.   Session 2: Pt seen for 1:1 OT session with focus on functional transfers, activity tolerance, and dynamic standing balance. Pt received supine in bed. Pt completed functional transfers bed<>w/c at supervision level using sliding board with pt directing care. Practiced walk-in shower transfer using shower chair. Pt completed squat pivot transfer w/c<>shower chair at supervision level 3x. Pt propelled self in w/c ~150 feet on tile floor and carpet with 3 rest breaks d/t fatigue. Engaged in therapeutic conversation throughout activity with focus on anticipatory awareness. Completed one legged sit<>stand from w/c at supervision level x10 reps with 3 rest breaks. Pt requesting to return to room d/t fatigue. Pt with no questions or concerns about discharge at this time. Pt left supine in bed with all needs in reach.  OT Discharge Precautions/Restrictions  Precautions Precautions: Fall Required Braces or Orthoses: Other Brace/Splint Knee Immobilizer - Right: On at all times Other Brace/Splint: RLE bledsoe brace; right short arm splint  Restrictions Weight Bearing Restrictions: Yes RUE Weight Bearing: Weight bear through elbow only LUE Weight Bearing: Non weight bearing RLE Weight Bearing: Non weight bearing Other Position/Activity Restrictions: WBAT LLE General   Vital Signs   Pain Pain Assessment Pain Assessment: No/denies pain ADL   Vision/Perception  Vision- History Baseline  Vision/History: Wears glasses Wears Glasses: Reading only Patient Visual  Report: No change from baseline Vision- Assessment Vision Assessment?: Yes Eye Alignment: Within Functional Limits Ocular Range of Motion: Within Functional Limits Tracking/Visual Pursuits: Able to track stimulus in all quads without difficulty  Cognition Overall Cognitive Status: Impaired/Different from baseline Arousal/Alertness: Awake/alert Orientation Level: Oriented X4 Attention: Selective;Alternating Sustained Attention: Appears intact Selective Attention: Appears intact Selective Attention Impairment: Verbal basic;Functional basic Alternating Attention: Appears intact Divided Attention: Appears intact Memory: Impaired Memory Impairment: Decreased recall of new information Awareness: Appears intact Problem Solving: Appears intact Reasoning: Appears intact Sequencing: Appears intact Decision Making: Appears intact Safety/Judgment: Appears intact [employer] Scales of Cognitive Functioning: Purposeful/appropriate Sensation Sensation Light Touch: Appears Intact Coordination Gross Motor Movements are Fluid and Coordinated: No Fine Motor Movements are Fluid and Coordinated: No Motor    Mobility     Trunk/Postural Assessment     Balance   Extremity/Trunk Assessment RUE Assessment RUE Assessment: Exceptions to [facility] RUE AROM (degrees) RUE Overall AROM Comments: shoulder/elbow WFL; wrist splint; able to move all digits WFL RUE Strength RUE Overall Strength: Unable to assess;Due to precautions LUE Assessment LUE Assessment: Not tested LUE Strength LUE Overall Strength Comments: NWB LUE; able to recall NWB precautions with min cues for carryover  See FIM for current functional status  [name], [name] 01/11/2014, 9:31 AM

## 2014-01-11 NOTE — Progress Notes (Signed)
Physical Therapy Note  Patient Details  Name: [name] MRN: [id number] Date of Birth: 09-Mar-1986 Today's Date: 01/11/2014  Patient missed 60 minutes of skilled physical therapy this PM secondary to declining to participate due to fatigue. See discharge summary for CLOF and participation with previous therapy sessions today.   Zella RicherBridget S Ahkeem Goede S. Oleda Borski, PT, DPT 01/11/2014, 3:35 PM
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
## 2014-01-11 NOTE — Progress Notes (Signed)
Social Work Patient ID: [name], male   DOB: 05/16/1986, 28 y.o.   MRN: [id number]   Met with pt, brother and mother yesterday (and this morning) following team conference.  All aware and agreeable with targeted d/c date of 7/2 with mod i w/c goals.  Also aware and agreeable to therapy request that no follow up therapy needed until he is allowed to weight bear and general restrictions are lifted.  Have reviewed follow up MD appointments with pt and mother and explained that these MDs can arrange [facility] f/u or they can contact me after d/c to arrange.  Will continue to follow for support and d/c needs.  Chika Cichowski, LCSW

## 2014-01-11 NOTE — Progress Notes (Signed)
Physical Therapy Session Note  Patient Details  Name: [name] MRN: [id number] Date of Birth: 05-Dec-1985  Today's Date: 01/11/2014 Time: 1002-1045    Short Term Goals: Week 1:  PT Short Term Goal 1 (Week 1): STGs modified secondary to short LOS; STGs=LTGs PT Short Term Goal 2 (Week 1): Pt will perform bed <> w/c transfers with mod A x 1. PT Short Term Goal 3 (Week 1): Pt will propel w/c using LLE x 150 ft with supervision.  PT Short Term Goal 4 (Week 1): Pt will maintain weightbearing precautions with functional mobility requiring 25% cuing.   Skilled Therapeutic Interventions/Progress Updates:    Pt received supine in bed awake, agreeable to participate in therapy. Session focused on home exercise instruction for post-discharge. Pt w/ SBT bed>w/c w/ supervision, mod (I) w/ w/c propulsion room to gym. Pt w/ scoot transfer to mat w/ supervision and sit to supine w/ mod (I). Educated pt on home exercise program including quad/glute sets, short arc quads, supine hip abduction, ankle circles, and straight leg raises. Pt demonstrated understanding of exercises. Pt w/ scoot transfers back to w/c w/ supervision, then mod (I) to propel back to room. Performed SBT back to bed w/ supervision, then sit to supine mod (I). Pt left supine in bed w/ mother present w/ all needs within reach.  Therapy Documentation Precautions:  Precautions Precautions: Fall Required Braces or Orthoses: Other Brace/Splint Knee Immobilizer - Right: On at all times Other Brace/Splint: RLE bledsoe brace; right short arm splint  Restrictions Weight Bearing Restrictions: Yes RUE Weight Bearing: Weight bear through elbow only LUE Weight Bearing: Non weight bearing RLE Weight Bearing: Non weight bearing Other Position/Activity Restrictions: WBAT LLE Pain: Pain Assessment Pain Assessment: 0-10 Pain Score: 5  Pain Location: Knee Pain Orientation: Right Pain Descriptors / Indicators: Aching Pain Onset:  On-going Patients Stated Pain Goal: 0 Pain Intervention(s): Other (Comment) (Repositioned)  See FIM for current functional status  Therapy/Group: Individual Therapy  Hosie SpangleGodfrey, [name] Hosie SpangleJess [name], PT, DPT  01/11/2014, 9:26 AM

## 2014-01-11 NOTE — Progress Notes (Signed)
Speech Language Pathology Session Note & Discharge Summary  Patient Details  Name: [name] MRN: [id number] Date of Birth: [birth year]  Today's Date: 01/11/2014 Time: 4627-0350 Time Calculation (min): 28 min  Skilled Therapeutic Interventions:  Skilled treatment session focused on cognitive goals. Pt followed 3 step commands with Mod I and recalled details from a paragraph read aloud with 66% accuracy. Pt also participated in a word-finding task with extra time and supervision semantic cues. Pt's mother present and reported no questions at this time. Pt will discharge home tomorrow with 24 hour supervision.   Patient has met 8 of 8 long term goals.  Patient to discharge at overall Modified Independent;Supervision level.   Reasons goals not met: N/A   Clinical Impression/Discharge Summary: Pt has made excellent gains and has met 8 of 8 LTG's this admission due to improved recall of new information, problem-solving, attention, verbal expression and auditory comprehension.  Currently, pt is demonstrating behaviors consistent with a [place]o Level VIII and is overall supervision-Mod I for complex cognitive tasks in regards to working memory, problem solving, attention and awareness. Pt also demonstrates increased word-finding and ability to follow complex commands. Pt/family education is complete with the patient's mother and pt will discharge home with 24 hour supervision from family. SLP f/u services are not warranted at this time. Care Partner:  Caregiver Able to Provide Assistance: Yes  Type of Caregiver Assistance: Physical;Cognitive  Recommendation:  None      Equipment: N/A  Reasons for discharge: Treatment goals met;Discharged from hospital   Patient/Family Agrees with Progress Made and Goals Achieved: Yes   See FIM for current functional status  Mee Macdonnell 01/11/2014, 12:04 PM

## 2014-01-11 NOTE — Discharge Summary (Signed)
Discharge summary job [id number]

## 2014-01-11 NOTE — Progress Notes (Addendum)
Physical Therapy Discharge Summary  Patient Details  Name: [name] MRN: [id number] Date of Birth: 1985/10/09  Today'Alexander Date: 01/11/2014 Time: 1601-0932 Time Calculation (min): 53 min  Patient has met 12 of 12 long term goals due to improved activity tolerance, improved balance, improved postural control, increased strength, decreased pain, ability to compensate for deficits, functional use of  right upper extremity and left lower extremity, improved attention, improved awareness and improved coordination.  Patient to discharge at a wheelchair level mod I - Supervision.   Patient'Alexander mother  is independent to provide the necessary set-up assistance at discharge. Additionally, patient able to direct his care for all functional mobility set up independently.  Reasons goals not met: N/A, all LTGs met.  Recommendation:  Patient will benefit from ongoing skilled PT services in outpatient setting (when weight bearing precautions are lifted) to continue to advance safe functional mobility, address ongoing impairments in strength, balance, gait, and overall functional mobility, and minimize fall risk.  Equipment: 18x18 wheelchair with basic cushion and elevating leg rests; slide board  Reasons for discharge: treatment goals met and discharge from hospital  Patient/family agrees with progress made and goals achieved: Yes  PT Discharge Precautions/Restrictions Precautions Precautions: Fall Required Braces or Orthoses: Other Brace/Splint Knee Immobilizer - Right: On at all times Other Brace/Splint: RLE bledsoe brace; right short arm splint  Restrictions Weight Bearing Restrictions: Yes RUE Weight Bearing: Weight bear through elbow only LUE Weight Bearing: Non weight bearing RLE Weight Bearing: Non weight bearing Other Position/Activity Restrictions: WBAT LLE Pain Pain Assessment Pain Assessment: No/denies pain Pain Score: 0-No pain Pain Location: Knee Pain Orientation: Right Pain  Descriptors / Indicators: Aching Pain Onset: On-going Patients Stated Pain Goal: 0 Pain Intervention(Alexander): Other (Comment) (Repositioned) Cognition Overall Cognitive Status: Impaired/Different from baseline Arousal/Alertness: Awake/alert Orientation Level: Oriented X4 Attention: Selective;Alternating Sustained Attention: Appears intact Memory: Impaired Memory Impairment: Decreased recall of new information Decreased Short Term Memory: Functional basic;Verbal basic Awareness: Appears intact Problem Solving: Appears intact Safety/Judgment: Appears intact Ranch[place] [employer] Scales of Cognitive Functioning: Purposeful/appropriate Sensation Sensation Light Touch: Impaired Detail Light Touch Impaired Details: Impaired RLE Proprioception: Appears Intact Coordination Gross Motor Movements are Fluid and Coordinated: No Fine Motor Movements are Fluid and Coordinated: No Motor  Motor Motor: Within Functional Limits  Mobility Bed Mobility Bed Mobility: Supine to Sit;Sit to Supine Supine to Sit: HOB flat;6: Modified independent (Device/Increase time) Sit to Supine: HOB flat;6: Modified independent (Device/Increase time) Transfers Transfers: Yes Stand Pivot Transfers: 5: Supervision;With armrests (Including furniture transfers) Lateral/Scoot Transfers: With slide board;5: Supervision;With armrests removed;From elevated surface Lateral/Scoot Transfer Details (indicate cue type and reason): set up assistance only; patient able to direct care for set up without any cues Locomotion  Ambulation Ambulation: No Ambulation/Gait Assistance: Not tested (comment) Gait Gait: No Stairs / Additional Locomotion Stairs: No Ramp: 6: Modified independent (Device) (seated in w/c; ascends backwards, descends fowards) Product manager Mobility: Yes Wheelchair Assistance: 6: Modified independent (Device/Increase time) Environmental health practitioner: Left lower extremity Wheelchair Parts Management:  Needs assistance (patient able to direct care of w/c parts management with mod I; requires physical assist to perform secondary to precautions) Distance: 200  Trunk/Postural Assessment  Cervical Assessment Cervical Assessment: Within Functional Limits Thoracic Assessment Thoracic Assessment: Within Functional Limits Lumbar Assessment Lumbar Assessment: Exceptions to [facility] (posterior pelvic tilt) Postural Control Postural Control: Within Functional Limits  Balance Balance Balance Assessed: Yes Static Sitting Balance Static Sitting - Balance Support: Feet supported;No upper extremity supported (L LE only) Static Sitting -  Level of Assistance: 6: Modified independent (Device/Increase time) Dynamic Sitting Balance Dynamic Sitting - Balance Support: During functional activity;Feet supported;No upper extremity supported (L LE only) Dynamic Sitting - Level of Assistance: 6: Modified independent (Device/Increase time) Dynamic Sitting - Balance Activities: Lateral lean/weight shifting;Forward lean/weight shifting;Reaching for objects Static Standing Balance Static Standing - Balance Support: No upper extremity supported;During functional activity Static Standing - Level of Assistance: 5: Stand by assistance Extremity Assessment  RLE Assessment RLE Assessment: Exceptions to [facility] RLE Strength RLE Overall Strength: Due to precautions;Due to pain;Deficits RLE Overall Strength Comments: Hip flexion and AAROM WFL except hip flexion limited to 90 deg LLE Assessment LLE Assessment: Within Functional Limits  See FIM for current functional status  [name] [name] Alexander. [name], PT, DPT 01/11/2014, 2:55 PM

## 2014-01-12 DIAGNOSIS — S82109A Unspecified fracture of upper end of unspecified tibia, initial encounter for closed fracture: Secondary | ICD-10-CM

## 2014-01-12 DIAGNOSIS — S72009A Fracture of unspecified part of neck of unspecified femur, initial encounter for closed fracture: Secondary | ICD-10-CM

## 2014-01-12 NOTE — Progress Notes (Signed)
Social Work  Discharge Note  The overall goal for the admission was met for:   Discharge location: Yes - home with mother in [place], [place]  Length of Stay: Yes - 6 days  Discharge activity level: Yes - modified independent w/c level  Home/community participation: Yes  Services provided included: MD, RD, PT, OT, SLP, RN, TR, Pharmacy and SW  Financial Services: Private Insurance: [employer] of [place]  Follow-up services arranged: DME: 18x18 lightweight w/c with ELRs, cushion, 30" transfer board [address] [place] and Patient/Family has no preference for HH/DME agencies  Comments (or additional information):  NO F/U THERAPY RECOMMENDED UNTIL PT HAS F/U APPOINTMENTS WITH MDs AND HE IS ALLOWED TO WEIGHT BEAR.  HAVE INSTRUCTED PT TO HAVE ORTHO MD [facility] ARRANGE F/U OR HE CAN CONTACT ME TO ARRANGE.  Patient/Family verbalized understanding of follow-up arrangements: Yes  Individual responsible for coordination of the follow-up plan: patient  Confirmed correct DME delivered: Eldonna Neuenfeldt 01/12/2014    Bella Brummet

## 2014-01-12 NOTE — Care Management Note (Signed)
Inpatient Rehabilitation Center Individual Statement of Services  Patient Name:  [name]  Date:  01/09/2014  Welcome to the Inpatient Rehabilitation Center.  Our goal is to provide you with an individualized program based on your diagnosis and situation, designed to meet your specific needs.  With this comprehensive rehabilitation program, you will be expected to participate in at least 3 hours of rehabilitation therapies Monday-Friday, with modified therapy programming on the weekends.  Your rehabilitation program will include the following services:  Physical Therapy (PT), Occupational Therapy (OT), Speech Therapy (ST), 24 hour per day rehabilitation nursing, Therapeutic Recreaction (TR), Case Management (Social Worker), Rehabilitation Medicine, Nutrition Services and Pharmacy Services  Weekly team conferences will be held on Tuesdays to discuss your progress.  Your Social Worker will talk with you frequently to get your input and to update you on team discussions.  Team conferences with you and your family in attendance may also be held.  Expected length of stay: 7-10 days  Overall anticipated outcome: modified ind/ wheelchair  Depending on your progress and recovery, your program may change. Your Social Worker will coordinate services and will keep you informed of any changes. Your Social Worker's name and contact numbers are listed  below.  The following services may also be recommended but are not provided by the Inpatient Rehabilitation Center:   Driving Evaluations  Home Health Rehabiltiation Services  Outpatient Rehabilitation Services  Vocational Rehabilitation   Arrangements will be made to provide these services after discharge if needed.  Arrangements include referral to agencies that provide these services.  Your insurance has been verified to be:  BCBS Your primary doctor is:  None  Pertinent information will be shared with your doctor and your insurance  company.  Social Worker:  StokesdaleLucy Keary [place], [place] [phone] or ([phone]) [phone]   Information discussed with and copy given to patient by: Amada JupiterHOYLE, Katheline Brendlinger, 01/09/2014, 4:24 PM

## 2014-01-12 NOTE — Discharge Instructions (Signed)
Inpatient Rehab Discharge Instructions  [name] Discharge date and time: No discharge date for patient encounter.   Activities/Precautions/ Functional Status: Activity: Nonweightbearing right lower extremity with Bledsoe brace, weightbearing as tolerated to right elbow, nonweightbearing left upper extremity Diet: regular diet Wound Care: keep wound clean and dry Functional status:  ___ No restrictions     ___ Walk up steps independently ___ 24/7 supervision/assistance   ___ Walk up steps with assistance ___ Intermittent supervision/assistance  ___ Bathe/dress independently ___ Walk with walker     ___ Bathe/dress with assistance ___ Walk Independently    ___ Shower independently _x__ Walk with assistance    ___ Shower with assistance ___ No alcohol     ___ Return to work/school ________     COMMUNITY REFERRALS UPON DISCHARGE:     Home Health:   NO FOLLOW UP THERAPY RECOMMENDED UNTIL YOUR WEIGHT BEARING RESTRICTIONS ARE LIFTED                             IF THE ORTHOPEDIC MD DOES NOT REFER TO HOME HEALTH, PLEASE CONTACT REHAB SOCIAL TO [name]                                                                                                                                                                                                                     [name], [place] @ [phone]  Medical Equipment/Items Ordered: wheelchair, cushion, transfer board                                                     Agency/Supplier: [facility] @ [phone]      Special Instructions:    My questions have been answered and I understand these instructions. I will adhere to these goals and the provided educational materials after my discharge from the hospital.  Patient/Caregiver Signature _______________________________ Date __________  Clinician Signature _______________________________________ Date __________  Please bring this form and your medication list with you to  all your follow-up doctor's appointments.

## 2014-01-12 NOTE — Progress Notes (Signed)
Pt discharged at 1000 with family to home. Discharge instructions given to pt and family by Deatra Inaan Angiulli, PA with verbal understanding. Staples removed by RN with site CDI. Bledsoe brace intact.

## 2014-03-17 ENCOUNTER — Inpatient Hospital Stay: Payer: BC Managed Care – PPO | Admitting: Physical Medicine & Rehabilitation

## 2014-04-13 ENCOUNTER — Other Ambulatory Visit (HOSPITAL_COMMUNITY): Payer: Self-pay | Admitting: Orthopedic Surgery

## 2014-04-14 ENCOUNTER — Encounter (HOSPITAL_COMMUNITY): Payer: Self-pay | Admitting: Pharmacy Technician

## 2014-04-14 NOTE — Pre-Procedure Instructions (Signed)
Simone CuriaSinclair Nakama  04/14/2014   Your procedure is scheduled on:  Tuesday, October 6.  [facility] at 0900 AM.  Call this number if you have problems the morning of surgery: [phone]   Remember:   Do not eat food or drink liquids after midnight.Monday night.   Take these medicines the morning of surgery with A SIP OF WATER: Hydrocodone, as needed for pain   Do not wear jewelry.  Do not wear lotions, powders, or perfumes. You may wear deodorant.  Do not shave 48 hours prior to surgery. Men may shave face and neck.  Do not bring valuables to the hospital.  [facility] is not responsible  for any belongings or valuables.               Contacts, dentures or bridgework may not be worn into surgery.  Leave suitcase in the car. After surgery it may be brought to your room.  For patients admitted to the hospital, discharge time is determined by your                treatment team.               Patients discharged the day of surgery will not be allowed to drive  home.  Name and phone number of your driver:      Special Instructions: [place] - Preparing for Surgery  Before surgery, you can play an important role.  Because skin is not sterile, your skin needs to be as free of germs as possible.  You can reduce the number of germs on you skin by washing with CHG (chlorahexidine gluconate) soap before surgery.  CHG is an antiseptic cleaner which kills germs and bonds with the skin to continue killing germs even after washing.  Please DO NOT use if you have an allergy to CHG or antibacterial soaps.  If your skin becomes reddened/irritated stop using the CHG and inform your nurse when you arrive at Short Stay.  Do not shave (including legs and underarms) for at least 48 hours prior to the first CHG shower.  You may shave your face.  Please follow these instructions carefully:   1.  Shower with CHG Soap the night before surgery and the     morning of  Surgery.  2.  If you choose to wash your hair, wash your hair first as usual with your   normal shampoo.  3.  After you shampoo, rinse your hair and body thoroughly to remove the   Shampoo.  4.  Use CHG as you would any other liquid soap.  You can apply chg directly   to the skin and wash gently with scrungie or a clean washcloth.  5.  Apply the CHG Soap to your body ONLY FROM THE NECK DOWN.   Do not use on open wounds or open sores.  Avoid contact with your eyes,   ears, mouth and genitals (private parts).  Wash genitals (private parts)   with your normal soap.  6.  Wash thoroughly, paying special attention to the area where your surgery  will be performed.  7.  Thoroughly rinse your body with warm water from the neck down.  8.  DO NOT shower/wash with your normal soap after using and rinsing off  the CHG Soap.  9.  Pat yourself dry with a clean towel.            10.  Wear clean pajamas.  11.  Place clean sheets on your bed the night of your first shower and do not  sleep with pets.  Day of Surgery  Do not apply any lotions/deoderants the morning of surgery.  Please wear clean clothes to the hospital/surgery center.     Please read over the following fact sheets that you were given: Pain Booklet, Coughing and Deep Breathing and Surgical Site Infection Prevention

## 2014-04-17 ENCOUNTER — Encounter (HOSPITAL_COMMUNITY): Payer: Self-pay

## 2014-04-17 ENCOUNTER — Encounter (HOSPITAL_COMMUNITY)
Admission: RE | Admit: 2014-04-17 | Discharge: 2014-04-17 | Disposition: A | Discharge status: Home / Self Care | Payer: BC Managed Care – PPO | Source: Ambulatory Visit | Attending: Orthopedic Surgery | Admitting: Orthopedic Surgery

## 2014-04-17 DIAGNOSIS — S83421A Sprain of lateral collateral ligament of right knee, initial encounter: Secondary | ICD-10-CM | POA: Diagnosis not present

## 2014-04-17 DIAGNOSIS — M94261 Chondromalacia, right knee: Secondary | ICD-10-CM | POA: Diagnosis not present

## 2014-04-17 DIAGNOSIS — S83511A Sprain of anterior cruciate ligament of right knee, initial encounter: Secondary | ICD-10-CM | POA: Diagnosis present

## 2014-04-17 LAB — CBC
HCT: 42.2 % (ref 39.0–52.0)
HEMOGLOBIN: 13.9 g/dL (ref 13.0–17.0)
MCH: 27.1 pg (ref 26.0–34.0)
MCHC: 32.9 g/dL (ref 30.0–36.0)
MCV: 82.3 fL (ref 78.0–100.0)
PLATELETS: 227 10*3/uL (ref 150–400)
RBC: 5.13 MIL/uL (ref 4.22–5.81)
RDW: 13.9 % (ref 11.5–15.5)
WBC: 4.7 10*3/uL (ref 4.0–10.5)

## 2014-04-17 LAB — SURGICAL PCR SCREEN
MRSA, PCR: NEGATIVE
STAPHYLOCOCCUS AUREUS: NEGATIVE

## 2014-04-17 MED ORDER — CLINDAMYCIN PHOSPHATE 900 MG/50ML IV SOLN
900.0000 mg | INTRAVENOUS | Status: AC
  Administered 2014-04-18: 900 mg via INTRAVENOUS
  Filled 2014-04-17: qty 50

## 2014-04-18 ENCOUNTER — Observation Stay (HOSPITAL_COMMUNITY)
Admission: RE | Admit: 2014-04-18 | Discharge: 2014-04-19 | Disposition: A | Discharge status: Home / Self Care | Payer: BC Managed Care – PPO | Source: Ambulatory Visit | Attending: Orthopedic Surgery | Admitting: Orthopedic Surgery

## 2014-04-18 ENCOUNTER — Ambulatory Visit (HOSPITAL_COMMUNITY): Payer: BC Managed Care – PPO | Admitting: Certified Registered Nurse Anesthetist

## 2014-04-18 ENCOUNTER — Encounter (HOSPITAL_COMMUNITY): Payer: Self-pay | Admitting: Certified Registered Nurse Anesthetist

## 2014-04-18 ENCOUNTER — Encounter (HOSPITAL_COMMUNITY): Payer: BC Managed Care – PPO | Admitting: Certified Registered Nurse Anesthetist

## 2014-04-18 ENCOUNTER — Encounter (HOSPITAL_COMMUNITY)
Admission: RE | Disposition: A | Discharge status: Home / Self Care | Payer: Self-pay | Source: Ambulatory Visit | Attending: Orthopedic Surgery

## 2014-04-18 DIAGNOSIS — S83421A Sprain of lateral collateral ligament of right knee, initial encounter: Secondary | ICD-10-CM | POA: Insufficient documentation

## 2014-04-18 DIAGNOSIS — S83511A Sprain of anterior cruciate ligament of right knee, initial encounter: Secondary | ICD-10-CM | POA: Diagnosis not present

## 2014-04-18 DIAGNOSIS — S83519A Sprain of anterior cruciate ligament of unspecified knee, initial encounter: Secondary | ICD-10-CM | POA: Diagnosis present

## 2014-04-18 DIAGNOSIS — M94261 Chondromalacia, right knee: Secondary | ICD-10-CM | POA: Insufficient documentation

## 2014-04-18 HISTORY — DX: Scoliosis, unspecified: M41.9

## 2014-04-18 HISTORY — PX: ANTERIOR CRUCIATE LIGAMENT REPAIR: SHX115

## 2014-04-18 HISTORY — PX: MEDIAL COLLATERAL LIGAMENT AND LATERAL COLLATERAL LIGAMENT REPAIR, KNEE: SHX2017

## 2014-04-18 SURGERY — RECONSTRUCTION, KNEE, ACL, USING HAMSTRING GRAFT
Anesthesia: General | Site: Knee | Laterality: Right

## 2014-04-18 MED ORDER — CLONIDINE HCL (ANALGESIA) 100 MCG/ML EP SOLN
150.0000 ug | EPIDURAL | Status: AC
  Filled 2014-04-18: qty 1.5

## 2014-04-18 MED ORDER — LACTATED RINGERS IV SOLN: INTRAVENOUS | Status: DC

## 2014-04-18 MED ORDER — METOCLOPRAMIDE HCL 5 MG PO TABS: 5.0000 mg | ORAL_TABLET | Freq: Three times a day (TID) | ORAL | Status: DC | PRN

## 2014-04-18 MED ORDER — KETOROLAC TROMETHAMINE 30 MG/ML IJ SOLN
INTRAMUSCULAR | Status: DC | PRN
  Administered 2014-04-18: 30 mg via INTRAVENOUS

## 2014-04-18 MED ORDER — FENTANYL CITRATE 0.05 MG/ML IJ SOLN
INTRAMUSCULAR | Status: AC
  Filled 2014-04-18: qty 5

## 2014-04-18 MED ORDER — OXYCODONE HCL 5 MG PO TABS
5.0000 mg | ORAL_TABLET | ORAL | Status: DC | PRN
  Administered 2014-04-18: 5 mg via ORAL
  Administered 2014-04-19 (×2): 10 mg via ORAL
  Filled 2014-04-18 (×2): qty 2

## 2014-04-18 MED ORDER — OXYCODONE HCL 5 MG PO TABS
ORAL_TABLET | ORAL | Status: AC
  Filled 2014-04-18: qty 1

## 2014-04-18 MED ORDER — WARFARIN VIDEO
Freq: Once | Status: AC
  Administered 2014-04-18: 19:00:00

## 2014-04-18 MED ORDER — ONDANSETRON HCL 4 MG/2ML IJ SOLN
INTRAMUSCULAR | Status: AC
  Filled 2014-04-18: qty 2

## 2014-04-18 MED ORDER — MORPHINE SULFATE 4 MG/ML IJ SOLN
INTRAMUSCULAR | Status: DC | PRN
  Administered 2014-04-18: 4 mg

## 2014-04-18 MED ORDER — ONDANSETRON HCL 4 MG PO TABS: 4.0000 mg | ORAL_TABLET | Freq: Four times a day (QID) | ORAL | Status: DC | PRN

## 2014-04-18 MED ORDER — NEOSTIGMINE METHYLSULFATE 10 MG/10ML IV SOLN
INTRAVENOUS | Status: DC | PRN
  Administered 2014-04-18: 4 mg via INTRAVENOUS

## 2014-04-18 MED ORDER — MORPHINE SULFATE 2 MG/ML IJ SOLN: 1.0000 mg | INTRAMUSCULAR | Status: DC | PRN

## 2014-04-18 MED ORDER — METHOCARBAMOL 1000 MG/10ML IJ SOLN
500.0000 mg | Freq: Four times a day (QID) | INTRAVENOUS | Status: DC | PRN
  Filled 2014-04-18: qty 5

## 2014-04-18 MED ORDER — DEXAMETHASONE SODIUM PHOSPHATE 4 MG/ML IJ SOLN
INTRAMUSCULAR | Status: AC
  Filled 2014-04-18: qty 1

## 2014-04-18 MED ORDER — CHLORHEXIDINE GLUCONATE 4 % EX LIQD
60.0000 mL | Freq: Once | CUTANEOUS | Status: DC
  Filled 2014-04-18: qty 60

## 2014-04-18 MED ORDER — LIDOCAINE HCL (CARDIAC) 20 MG/ML IV SOLN
INTRAVENOUS | Status: DC | PRN
  Administered 2014-04-18: 60 mg via INTRAVENOUS

## 2014-04-18 MED ORDER — LIDOCAINE HCL (CARDIAC) 20 MG/ML IV SOLN
INTRAVENOUS | Status: AC
  Filled 2014-04-18: qty 5

## 2014-04-18 MED ORDER — MIDAZOLAM HCL 5 MG/5ML IJ SOLN
INTRAMUSCULAR | Status: DC | PRN
  Administered 2014-04-18: 2 mg via INTRAVENOUS

## 2014-04-18 MED ORDER — ROCURONIUM BROMIDE 50 MG/5ML IV SOLN
INTRAVENOUS | Status: AC
  Filled 2014-04-18: qty 1

## 2014-04-18 MED ORDER — SODIUM CHLORIDE 0.9 % IR SOLN
Status: DC | PRN
  Administered 2014-04-18 (×8): 3000 mL

## 2014-04-18 MED ORDER — METHOCARBAMOL 500 MG PO TABS
ORAL_TABLET | ORAL | Status: AC
  Filled 2014-04-18: qty 1

## 2014-04-18 MED ORDER — METHOCARBAMOL 500 MG PO TABS
500.0000 mg | ORAL_TABLET | Freq: Four times a day (QID) | ORAL | Status: DC | PRN
  Administered 2014-04-18: 500 mg via ORAL

## 2014-04-18 MED ORDER — HYDROMORPHONE HCL 1 MG/ML IJ SOLN
0.2500 mg | INTRAMUSCULAR | Status: DC | PRN
  Administered 2014-04-18 (×2): 0.5 mg via INTRAVENOUS

## 2014-04-18 MED ORDER — PROPOFOL 10 MG/ML IV BOLUS
INTRAVENOUS | Status: DC | PRN
  Administered 2014-04-18: 140 mg via INTRAVENOUS

## 2014-04-18 MED ORDER — MIDAZOLAM HCL 2 MG/2ML IJ SOLN
INTRAMUSCULAR | Status: AC
  Filled 2014-04-18: qty 2

## 2014-04-18 MED ORDER — LACTATED RINGERS IV SOLN
INTRAVENOUS | Status: DC | PRN
  Administered 2014-04-18 (×3): via INTRAVENOUS

## 2014-04-18 MED ORDER — CLONIDINE HCL (ANALGESIA) 100 MCG/ML EP SOLN
EPIDURAL | Status: DC | PRN
  Administered 2014-04-18: .75 mL

## 2014-04-18 MED ORDER — BUPIVACAINE HCL (PF) 0.25 % IJ SOLN
INTRAMUSCULAR | Status: AC
  Filled 2014-04-18: qty 30

## 2014-04-18 MED ORDER — HYDROMORPHONE HCL 1 MG/ML IJ SOLN
INTRAMUSCULAR | Status: AC
  Filled 2014-04-18: qty 1

## 2014-04-18 MED ORDER — 0.9 % SODIUM CHLORIDE (POUR BTL) OPTIME
TOPICAL | Status: DC | PRN
  Administered 2014-04-18 (×2): 1000 mL

## 2014-04-18 MED ORDER — ARTIFICIAL TEARS OP OINT
TOPICAL_OINTMENT | OPHTHALMIC | Status: DC | PRN
  Administered 2014-04-18: 1 via OPHTHALMIC

## 2014-04-18 MED ORDER — WARFARIN SODIUM 10 MG PO TABS
10.0000 mg | ORAL_TABLET | ORAL | Status: AC
  Administered 2014-04-18: 10 mg via ORAL
  Filled 2014-04-18: qty 1

## 2014-04-18 MED ORDER — COUMADIN BOOK
Freq: Once | Status: AC
  Administered 2014-04-18: 19:00:00
  Filled 2014-04-18: qty 1

## 2014-04-18 MED ORDER — LACTATED RINGERS IV SOLN
INTRAVENOUS | Status: DC
  Administered 2014-04-18: 09:00:00 via INTRAVENOUS

## 2014-04-18 MED ORDER — WARFARIN - PHARMACIST DOSING INPATIENT: Freq: Every day | Status: DC

## 2014-04-18 MED ORDER — ROCURONIUM BROMIDE 100 MG/10ML IV SOLN
INTRAVENOUS | Status: DC | PRN
  Administered 2014-04-18: 30 mg via INTRAVENOUS
  Administered 2014-04-18: 50 mg via INTRAVENOUS

## 2014-04-18 MED ORDER — CLINDAMYCIN PHOSPHATE 600 MG/50ML IV SOLN
600.0000 mg | Freq: Four times a day (QID) | INTRAVENOUS | Status: AC
  Administered 2014-04-18 (×2): 600 mg via INTRAVENOUS
  Filled 2014-04-18 (×2): qty 50

## 2014-04-18 MED ORDER — FENTANYL CITRATE 0.05 MG/ML IJ SOLN
INTRAMUSCULAR | Status: DC | PRN
Start: 2014-04-18 — End: 2014-04-18
  Administered 2014-04-18 (×3): 50 ug via INTRAVENOUS
  Administered 2014-04-18: 100 ug via INTRAVENOUS
  Administered 2014-04-18 (×3): 50 ug via INTRAVENOUS
  Administered 2014-04-18: 100 ug via INTRAVENOUS

## 2014-04-18 MED ORDER — PROPOFOL 10 MG/ML IV BOLUS
INTRAVENOUS | Status: AC
  Filled 2014-04-18: qty 20

## 2014-04-18 MED ORDER — DEXAMETHASONE SODIUM PHOSPHATE 4 MG/ML IJ SOLN
INTRAMUSCULAR | Status: DC | PRN
  Administered 2014-04-18: 4 mg via INTRAVENOUS

## 2014-04-18 MED ORDER — MORPHINE SULFATE 4 MG/ML IJ SOLN
INTRAMUSCULAR | Status: AC
  Filled 2014-04-18: qty 1

## 2014-04-18 MED ORDER — ONDANSETRON HCL 4 MG/2ML IJ SOLN
INTRAMUSCULAR | Status: DC | PRN
  Administered 2014-04-18: 4 mg via INTRAVENOUS

## 2014-04-18 MED ORDER — POTASSIUM CHLORIDE IN NACL 20-0.9 MEQ/L-% IV SOLN
INTRAVENOUS | Status: DC
  Administered 2014-04-18: 22:00:00 via INTRAVENOUS
  Filled 2014-04-18 (×3): qty 1000

## 2014-04-18 MED ORDER — BUPIVACAINE HCL (PF) 0.25 % IJ SOLN
INTRAMUSCULAR | Status: DC | PRN
  Administered 2014-04-18: 10 mL

## 2014-04-18 MED ORDER — KETOROLAC TROMETHAMINE 30 MG/ML IJ SOLN
INTRAMUSCULAR | Status: AC
  Filled 2014-04-18: qty 1

## 2014-04-18 MED ORDER — ONDANSETRON HCL 4 MG/2ML IJ SOLN: 4.0000 mg | Freq: Four times a day (QID) | INTRAMUSCULAR | Status: DC | PRN

## 2014-04-18 MED ORDER — GLYCOPYRROLATE 0.2 MG/ML IJ SOLN
INTRAMUSCULAR | Status: DC | PRN
  Administered 2014-04-18: .6 mg via INTRAVENOUS

## 2014-04-18 MED ORDER — METOCLOPRAMIDE HCL 5 MG/ML IJ SOLN
5.0000 mg | Freq: Three times a day (TID) | INTRAMUSCULAR | Status: DC | PRN
Start: 2014-04-18 — End: 2014-04-19

## 2014-04-18 SURGICAL SUPPLY — 103 items
ANCHOR BUTTON TIGHTROPE ACL RT (Orthopedic Implant) ×6 IMPLANT
BANDAGE ELASTIC 6 VELCRO ST LF (GAUZE/BANDAGES/DRESSINGS) IMPLANT
BANDAGE ESMARK 6X9 LF (GAUZE/BANDAGES/DRESSINGS) ×1 IMPLANT
BIT DRILL STEP 3.5 (DRILL) IMPLANT
BLADE CUDA 5.5 (BLADE) IMPLANT
BLADE GREAT WHITE 4.2 (BLADE) ×2 IMPLANT
BLADE GREAT WHITE 4.2MM (BLADE) ×1
BLADE SURG 10 STRL SS (BLADE) ×3 IMPLANT
BLADE SURG 15 STRL LF DISP TIS (BLADE) ×2 IMPLANT
BLADE SURG 15 STRL SS (BLADE) ×4
BNDG ELASTIC 6X15 VLCR STRL LF (GAUZE/BANDAGES/DRESSINGS) ×3 IMPLANT
BNDG ESMARK 6X9 LF (GAUZE/BANDAGES/DRESSINGS) ×3
BONE MATRIX DEMINERALIZED 1CC (Bone Implant) ×6 IMPLANT
BUR OVAL 6.0 (BURR) ×3 IMPLANT
BUTTON EXT TIGHTROPE 5X20 (Orthopedic Implant) ×4 IMPLANT
BUTTON EXT TIGHTROPE 5X20MM (Orthopedic Implant) ×2 IMPLANT
CLOSURE STERI-STRIP 1/2X4 (GAUZE/BANDAGES/DRESSINGS) ×2
CLSR STERI-STRIP ANTIMIC 1/2X4 (GAUZE/BANDAGES/DRESSINGS) ×4 IMPLANT
COVER SURGICAL LIGHT HANDLE (MISCELLANEOUS) ×3 IMPLANT
CUFF TOURNIQUET SINGLE 24IN (TOURNIQUET CUFF) ×6 IMPLANT
CUFF TOURNIQUET SINGLE 34IN LL (TOURNIQUET CUFF) IMPLANT
CUFF TOURNIQUET SINGLE 44IN (TOURNIQUET CUFF) IMPLANT
DECANTER SPIKE VIAL GLASS SM (MISCELLANEOUS) IMPLANT
DRAPE ARTHROSCOPY W/POUCH 114 (DRAPES) ×3 IMPLANT
DRAPE INCISE IOBAN 66X45 STRL (DRAPES) ×6 IMPLANT
DRAPE STERI 35X30 U-POUCH (DRAPES) ×3 IMPLANT
DRAPE U-SHAPE 47X51 STRL (DRAPES) ×6 IMPLANT
DRILL FLIPCUTTER II 9.0MM (INSTRUMENTS) ×1 IMPLANT
DRILL STEP 3.5 (DRILL)
DRSG MEPILEX BORDER 4X4 (GAUZE/BANDAGES/DRESSINGS) ×3 IMPLANT
DRSG PAD ABDOMINAL 8X10 ST (GAUZE/BANDAGES/DRESSINGS) ×3 IMPLANT
ELECT REM PT RETURN 9FT ADLT (ELECTROSURGICAL) ×3
ELECTRODE REM PT RTRN 9FT ADLT (ELECTROSURGICAL) ×1 IMPLANT
EVACUATOR 1/8 PVC DRAIN (DRAIN) IMPLANT
FIBERSTICK 2 (SUTURE) ×3 IMPLANT
FLIPCUTTER II 9.0MM (INSTRUMENTS) ×3
GAUZE SPONGE 4X4 12PLY STRL (GAUZE/BANDAGES/DRESSINGS) IMPLANT
GAUZE XEROFORM 1X8 LF (GAUZE/BANDAGES/DRESSINGS) ×3 IMPLANT
GLOVE BIOGEL PI IND STRL 6.5 (GLOVE) ×1 IMPLANT
GLOVE BIOGEL PI IND STRL 7.5 (GLOVE) ×2 IMPLANT
GLOVE BIOGEL PI IND STRL 8 (GLOVE) ×1 IMPLANT
GLOVE BIOGEL PI INDICATOR 6.5 (GLOVE) ×2
GLOVE BIOGEL PI INDICATOR 7.5 (GLOVE) ×4
GLOVE BIOGEL PI INDICATOR 8 (GLOVE) ×2
GLOVE ECLIPSE 7.0 STRL STRAW (GLOVE) ×12 IMPLANT
GLOVE SURG ORTHO 8.0 STRL STRW (GLOVE) ×9 IMPLANT
GLOVE SURG SS PI 6.0 STRL IVOR (GLOVE) ×3 IMPLANT
GLOVE SURG SS PI 7.5 STRL IVOR (GLOVE) ×3 IMPLANT
GOWN STRL REUS W/ TWL LRG LVL3 (GOWN DISPOSABLE) ×4 IMPLANT
GOWN STRL REUS W/ TWL XL LVL3 (GOWN DISPOSABLE) ×1 IMPLANT
GOWN STRL REUS W/TWL LRG LVL3 (GOWN DISPOSABLE) ×8
GOWN STRL REUS W/TWL XL LVL3 (GOWN DISPOSABLE) ×2
KIT BASIN OR (CUSTOM PROCEDURE TRAY) ×3 IMPLANT
KIT BIO-TENODESIS 3X8 DISP (MISCELLANEOUS) ×2
KIT BIOCARTILAGE DEL W/SYRINGE (KITS) ×3 IMPLANT
KIT INSRT BABSR STRL DISP BTN (MISCELLANEOUS) ×1 IMPLANT
KIT ROOM TURNOVER OR (KITS) ×3 IMPLANT
LOOP VESSEL MAXI BLUE (MISCELLANEOUS) ×3 IMPLANT
MANIFOLD NEPTUNE II (INSTRUMENTS) ×3 IMPLANT
NEEDLE 18GX1X1/2 (RX/OR ONLY) (NEEDLE) ×3 IMPLANT
NS IRRIG 1000ML POUR BTL (IV SOLUTION) ×9 IMPLANT
PACK ARTHROSCOPY DSU (CUSTOM PROCEDURE TRAY) ×3 IMPLANT
PAD ARMBOARD 7.5X6 YLW CONV (MISCELLANEOUS) ×3 IMPLANT
PAD CAST 4YDX4 CTTN HI CHSV (CAST SUPPLIES) ×1 IMPLANT
PADDING CAST COTTON 4X4 STRL (CAST SUPPLIES) ×2
PADDING CAST COTTON 6X4 STRL (CAST SUPPLIES) ×3 IMPLANT
PENCIL BUTTON HOLSTER BLD 10FT (ELECTRODE) ×3 IMPLANT
PLUERISTICK MICROFX CASE (MISCELLANEOUS) ×3 IMPLANT
REAMER C 10MM (INSTRUMENTS) IMPLANT
SCREW BIO TENODESIS 5.5 (Screw) ×3 IMPLANT
SCREW TENODESIS BIOCOMP 7MM (Screw) ×3 IMPLANT
SET ARTHROSCOPY TUBING (MISCELLANEOUS) ×2
SET ARTHROSCOPY TUBING LN (MISCELLANEOUS) ×1 IMPLANT
SPONGE GAUZE 4X4 12PLY STER LF (GAUZE/BANDAGES/DRESSINGS) ×6 IMPLANT
SPONGE LAP 18X18 X RAY DECT (DISPOSABLE) ×3 IMPLANT
SPONGE LAP 4X18 X RAY DECT (DISPOSABLE) IMPLANT
SPONGE SCRUB IODOPHOR (GAUZE/BANDAGES/DRESSINGS) IMPLANT
SUCTION FRAZIER TIP 10 FR DISP (SUCTIONS) ×3 IMPLANT
SUT 2 FIBERLOOP 20 STRT BLUE (SUTURE) ×12
SUT ETHILON 3 0 PS 1 (SUTURE) ×9 IMPLANT
SUT FIBERWIRE #2 38 T-5 BLUE (SUTURE) ×6
SUT MENISCAL KIT (KITS) IMPLANT
SUT PROLENE 3 0 PS 2 (SUTURE) ×3 IMPLANT
SUT VIC AB 0 CT1 27 (SUTURE) ×10
SUT VIC AB 0 CT1 27XBRD ANBCTR (SUTURE) ×5 IMPLANT
SUT VIC AB 2-0 CT1 27 (SUTURE) ×10
SUT VIC AB 2-0 CT1 TAPERPNT 27 (SUTURE) ×5 IMPLANT
SUT VICRYL 0 TIES 12 18 (SUTURE) IMPLANT
SUTURE 2 FIBERLOOP 20 STRT BLU (SUTURE) ×4 IMPLANT
SUTURE FIBERWR #2 38 T-5 BLUE (SUTURE) ×2 IMPLANT
SUTURE TIGERSTICK 2 TIGERWIR 2 (MISCELLANEOUS) ×1 IMPLANT
SYR 30ML LL (SYRINGE) ×3 IMPLANT
SYR 30ML SLIP (SYRINGE) ×3 IMPLANT
SYR BULB IRRIGATION 50ML (SYRINGE) ×3 IMPLANT
SYR TB 1ML LUER SLIP (SYRINGE) ×3 IMPLANT
TIGERSTICK 2 TIGERWIRE 2 (MISCELLANEOUS) ×3
TOWEL OR 17X24 6PK STRL BLUE (TOWEL DISPOSABLE) ×3 IMPLANT
TOWEL OR 17X26 10 PK STRL BLUE (TOWEL DISPOSABLE) ×3 IMPLANT
UNDERPAD 30X30 INCONTINENT (UNDERPADS AND DIAPERS) IMPLANT
WAND HAND CNTRL MULTIVAC 90 (MISCELLANEOUS) IMPLANT
WATER STERILE IRR 1000ML POUR (IV SOLUTION) IMPLANT
WRAP KNEE MAXI GEL POST OP (GAUZE/BANDAGES/DRESSINGS) IMPLANT
YANKAUER SUCT BULB TIP NO VENT (SUCTIONS) ×3 IMPLANT

## 2014-04-18 NOTE — Brief Op Note (Signed)
04/18/2014  3:16 PM  PATIENT:  [name]  28 y.o. male  PRE-OPERATIVE DIAGNOSIS:  RIGHT KNEE ACL, LCL,  PLC  And MFC chondromalacia  POST-OPERATIVE DIAGNOSIS:  RIGHT KNEE ACL, LCL, PLC and MFC chondromalacia   PROCEDURE:  Procedure(s): RECONSTRUCTION ANTERIOR CRUCIATE LIGAMENT (ACL) WITH HAMSTRING GRAFT, PLC and LCL reconstruction with hamstring - microfracture MFC  SURGEON:  Surgeon(s): Cammy CopaGregory Scott Dean, MD Coralyn MarkNaiping Glee ArvinMichael Xu, MD  ASSISTANT: s vernon pa  ANESTHESIA:   general  EBL: 150 ml    Total I/O In: 2000 [I.V.:2000] Out: 450 [Urine:200; Blood:250]  BLOOD ADMINISTERED: none  DRAINS: none   LOCAL MEDICATIONS USED:  none  SPECIMEN:  No Specimen  COUNTS:  YES  TOURNIQUET:   Total Tourniquet Time Documented: Thigh (Right) - 18 minutes Thigh (Right) - 111 minutes Total: Thigh (Right) - 129 minutes  Thigh (Left) - 10 minutes Total: Thigh (Left) - 10 minutes   DICTATION: .Other Dictation: Dictation Number [id number]  PLAN OF CARE: Admit for overnight observation  PATIENT DISPOSITION:  PACU - hemodynamically stable
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
## 2014-04-18 NOTE — Anesthesia Procedure Notes (Signed)
Procedure Name: Intubation Date/Time: 04/18/2014 10:38 AM Performed by: Reine JustFLOWERS, Ammon Muscatello T Pre-anesthesia Checklist: Patient identified, Emergency Drugs available, Suction available, Patient being monitored and Timeout performed Patient Re-evaluated:Patient Re-evaluated prior to inductionOxygen Delivery Method: Circle system utilized and Simple face mask Preoxygenation: Pre-oxygenation with 100% oxygen Intubation Type: IV induction Ventilation: Mask ventilation without difficulty Laryngoscope Size: Miller and 2 Grade View: Grade I Tube type: Oral Tube size: 7.5 mm Number of attempts: 1 Airway Equipment and Method: Patient positioned with wedge pillow and Stylet Placement Confirmation: ETT inserted through vocal cords under direct vision,  positive ETCO2 and breath sounds checked- equal and bilateral Secured at: 21 cm Tube secured with: Tape Dental Injury: Teeth and Oropharynx as per pre-operative assessment

## 2014-04-18 NOTE — Anesthesia Postprocedure Evaluation (Signed)
  Anesthesia Post-op Note  Patient: [name]  Procedure(s) Performed: Procedure(s) with comments: RECONSTRUCTION ANTERIOR CRUCIATE LIGAMENT (ACL) WITH HAMSTRING GRAFT (Right) - RIGHT KNEE ACL RECONSTRUCTION, LCL RECONSTRUCTION, PLC RECONSTRUCTION, POSSIBLE MENISCAL REPAIR VERSES RESECTION, HAMSTRING AUTOGRAFT.  Patient Location: PACU  Anesthesia Type:General  Level of Consciousness: awake  Airway and Oxygen Therapy: Patient Spontanous Breathing  Post-op Pain: mild  Post-op Assessment: Post-op Vital signs reviewed  Post-op Vital Signs: Reviewed  Last Vitals:  Filed Vitals:   04/18/14 1659  BP:   Pulse: 70  Temp: 36.6 C  Resp: 14    Complications: No apparent anesthesia complications

## 2014-04-18 NOTE — Transfer of Care (Signed)
Immediate Anesthesia Transfer of Care Note  Patient: Alexander CuriaSinclair Cornacchia  Procedure(s) Performed: Procedure(s) with comments: RECONSTRUCTION ANTERIOR CRUCIATE LIGAMENT (ACL) WITH HAMSTRING GRAFT (Right) - RIGHT KNEE ACL RECONSTRUCTION, LCL RECONSTRUCTION, PLC RECONSTRUCTION, POSSIBLE MENISCAL REPAIR VERSES RESECTION, HAMSTRING AUTOGRAFT.  Patient Location: PACU  Anesthesia Type:General  Level of Consciousness: awake and alert   Airway & Oxygen Therapy: Patient Spontanous Breathing and Patient connected to nasal cannula oxygen  Post-op Assessment: Report given to PACU RN, Post -op Vital signs reviewed and stable and Patient moving all extremities X 4  Post vital signs: Reviewed and stable  Complications: No apparent anesthesia complications
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
## 2014-04-18 NOTE — Anesthesia Preprocedure Evaluation (Addendum)
Anesthesia Evaluation  Patient identified by MRN, date of birth, ID band Patient awake  General Assessment Comment:History noted. CE  Reviewed: Allergy & Precautions, H&P , NPO status , Patient's Chart, lab work & pertinent test results  Airway Mallampati: II      Dental   Pulmonary neg pulmonary ROS,  breath sounds clear to auscultation        Cardiovascular negative cardio ROS  Rhythm:Regular Rate:Normal     Neuro/Psych    GI/Hepatic negative GI ROS, Neg liver ROS,   Endo/Other  negative endocrine ROS  Renal/GU negative Renal ROS     Musculoskeletal   Abdominal   Peds  Hematology   Anesthesia Other Findings   Reproductive/Obstetrics                          Anesthesia Physical Anesthesia Plan  ASA: II  Anesthesia Plan: General   Post-op Pain Management:    Induction: Intravenous  Airway Management Planned: Oral ETT  Additional Equipment:   Intra-op Plan:   Post-operative Plan: Extubation in OR  Informed Consent: I have reviewed the patients History and Physical, chart, labs and discussed the procedure including the risks, benefits and alternatives for the proposed anesthesia with the patient or authorized representative who has indicated his/her understanding and acceptance.   Dental advisory given  Plan Discussed with: CRNA and Anesthesiologist  Anesthesia Plan Comments:        Anesthesia Quick Evaluation

## 2014-04-19 DIAGNOSIS — S83511A Sprain of anterior cruciate ligament of right knee, initial encounter: Secondary | ICD-10-CM | POA: Diagnosis not present

## 2014-04-19 LAB — PROTIME-INR
INR: 1.19 (ref 0.00–1.49)
PROTHROMBIN TIME: 15.1 s (ref 11.6–15.2)

## 2014-04-19 MED ORDER — OXYCODONE HCL 5 MG PO TABS: 5.0000 mg | ORAL_TABLET | ORAL | Status: AC | PRN

## 2014-04-19 MED ORDER — METHOCARBAMOL 500 MG PO TABS: 500.0000 mg | ORAL_TABLET | Freq: Four times a day (QID) | ORAL | Status: AC | PRN

## 2014-04-19 MED ORDER — WARFARIN SODIUM 5 MG PO TABS: 5.0000 mg | ORAL_TABLET | Freq: Every day | ORAL | Status: AC

## 2014-04-19 MED ORDER — WARFARIN SODIUM 5 MG PO TABS
5.0000 mg | ORAL_TABLET | Freq: Once | ORAL | Status: AC
  Administered 2014-04-19: 5 mg via ORAL
  Filled 2014-04-19: qty 1

## 2014-04-19 NOTE — Care Management Note (Signed)
  Page 2 of 2   04/19/2014     11:20:40 AM CARE MANAGEMENT NOTE 04/19/2014  Patient:  [name],Alexander   Account Number:  [id number]  Date Initiated:  04/19/2014  Documentation initiated by:  Ronny FlurryWILE,Declynn Lopresti  Subjective/Objective Assessment:     Action/Plan:   Anticipated DC Date:  04/19/2014   Anticipated DC Plan:  HOME W HOME HEALTH SERVICES         Choice offered to / List presented to:  C-1 Patient   DME arranged  [facility]      DME agency  [facility]     [facility]  HH[facility] RN      [facility]  [facility]   Status of service:   Medicare Important Message given?   (If response is "NO", the following Medicare IM given date fields will be blank) Date Medicare IM given:   Medicare IM given by:   Date Additional Medicare IM given:   Additional Medicare IM given by:    Discharge Disposition:  HOME W HOME HEALTH SERVICES  Per UR Regulation:  Reviewed for med. necessity/level of care/duration of stay  If discussed at Long Length of Stay Meetings, dates discussed:    Comments:  04-19-14 Confirmed face sheet information with patient . Kipp BroodBrent with [facility] will provide CPM . Walker ordered through [facility] .  Explained to patient home health  RN , lab ( INR ) check on Tuesday Oct 13 and Friday October 16, [facility]. [facility] , cannot see patient until Saturday Oct 10.  Spoke with Toniann FailWendy at [facility] offices , she is aware and gave new order for INR check Saturday April 22, 2014 , Tuesday April 25, 2014 and Friday April 28, 2014 , [facility] to manage. Mary with [facility] aware and accepted referral . Patient aware.   Per Toniann FailWendy no home health PT at this time. Patient is aware.  Ronny FlurryHeather Liana Camerer RN BSN

## 2014-04-19 NOTE — Progress Notes (Signed)
Subjective: Pt stable - pain ok   Objective: Vital signs in last 24 hours: Temp:  [97.7 F (36.5 C)-98.9 F (37.2 C)] 98.3 F (36.8 C) (10/07 0523) Pulse Rate:  [66-96] 68 (10/07 0523) Resp:  [10-18] 18 (10/07 0523) BP: (93-132)/(49-76) 93/49 mmHg (10/07 0523) SpO2:  [99 %-100 %] 100 % (10/07 0523) Weight:  [63.277 kg (139 lb 8 oz)] 63.277 kg (139 lb 8 oz) (10/06 0905)  Intake/Output from previous day: 10/06 0701 - 10/07 0700 In: 3964.2 [P.O.:600; I.V.:3364.2] Out: 801 [Urine:200; Blood:250] Intake/Output this shift:    Exam:  Sensation intact distally Intact pulses distally Dorsiflexion/Plantar flexion intact  Labs:  Recent Labs  04/17/14 1005  HGB 13.9    Recent Labs  04/17/14 1005  WBC 4.7  RBC 5.13  HCT 42.2  PLT 227   No results found for this basename: NA, K, CL, CO2, BUN, CREATININE, GLUCOSE, CALCIUM,  in the last 72 hours No results found for this basename: LABPT, INR,  in the last 72 hours  Assessment/Plan: Plan dc today after CPM this am   DEAN,GREGORY SCOTT 04/19/2014, 7:22 AM
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
## 2014-04-19 NOTE — Progress Notes (Signed)
Orthopedic Tech Progress Note Patient Details:  [name] 1985/12/31 [id number]  CPM Right Knee CPM Right Knee: On Right Knee Flexion (Degrees): 30 Right Knee Extension (Degrees): 0   [name], [name] 04/19/2014, 10:06 AM

## 2014-04-19 NOTE — Discharge Instructions (Signed)

## 2014-04-19 NOTE — Progress Notes (Signed)
Pt has not urinate since 1730 yesterday. Does not have an urge to void right now. Bladder scan done, shows 351ml in the bladder. Dr Roda ShuttersXu notified said to give pt a couple more hours for pt to void on his own. Will continue to monitor.

## 2014-04-19 NOTE — Progress Notes (Signed)
AVS discharge instructions were given and went over with patient. Patient was also given prescriptions for oxycodone,robaxin, and coumadin to take to his pharmacy. Patient stated that he did not have any questions. Staff will assist patient to his transportation when patient's mother arrives.

## 2014-04-19 NOTE — Op Note (Signed)
NAME:  [name], [name]         ACCOUNT NO.:  [id number]  MEDICAL RECORD NO.:  [id number]  LOCATION:  [id number]                        FACILITY:  [facility]  PHYSICIAN:  Burnard BuntingG. Scott Jalayiah Bibian, M.D.    DATE OF BIRTH:  [birth year]  DATE OF PROCEDURE: DATE OF DISCHARGE:                              OPERATIVE REPORT   PREOPERATIVE DIAGNOSIS:  Right knee anterior cruciate ligament tear, lateral collateral ligament tear, posterolateral corner tear with chondral damage, medial femoral condyle.  POSTOPERATIVE DIAGNOSIS:  Right knee anterior cruciate ligament tear, lateral collateral ligament tear, posterolateral corner tear with chondral damage, medial femoral condyle.  PROCEDURE: 1. ACL reconstruction using autologous hamstring autograft, 9-mm     graft, with EndoButton technique. 2. Open lateral collateral ligament reconstruction and posterior     lateral corner reconstruction using hamstring allograft and     interference screw fixation x2 on the femur. 3. Microfracture medial femoral condyle.  SURGEON:  Burnard BuntingG. Scott Jaidy Cottam, MD.  ASSISTANT:  Wende NeighborsSheila M. Vernon, PA.  INDICATIONS:  [name] is a patient who sustained right knee injury, which was significant.  He had medial femoral condyle fracture several months ago along with ACL and lateral-sided ligamentous injury.  He presents now for operative management after fixation and healing of the medial femoral condyle injury.  PROCEDURE IN DETAIL:  The patient was brought to operating room where general endotracheal anesthesia was induced.  Preop antibiotics administered.  Time-out was called.  Right leg was examined under anesthesia.  He was found to have at the beginning of the case about a 7- degree flexion contracture.  Patient's ACL was out.  Positive Lachman's. Positive pivot shift.  Positive anterior drawer.  MCL was intact.  LCL had increased laxity about 4 mm opening it and full extension and about 6 mm of opening and 30 degrees of flexion.  The  patient also had posterolateral rotatory instability noted on the right compared to the left with about 15-20 degrees more external rotation at 30 degrees of flexion on the right compared to the left.  Following examination under anesthesia, both legs were prescrubbed with alcohol and Betadine, allowed to air dry, prepped with DuraPrep solution, draped in sterile manner.  On the left side, the leg was elevated and exsanguinated with Esmarch wrap.  Tourniquet was inflated.  Total tourniquet time on the left was 10 minutes.  Semitendinosus graft was harvested with care being taken in to avoid injury to the saphenous nerve.  This was prepared on the back table by Maud DeedSheila Vernon.  Thorough irrigation was performed and after the tourniquet was released and this incision was closed using 2-0 Vicryl and 3-0 nylon.  Bulky dressing applied here on the right hand side.  Leg was elevated and exsanguinated with Esmarch wrap.  Tourniquet was inflated.  It should be noted that both legs were covered with Ioban prior to making the incision.  Incision was made in the medial aspect of the medial and distal to the tibial tubercle.  Skin and subcutaneous tissue were sharply divided.  Semitendinosus again was harvested and then prepared on the back table to a size 9 mm graft by Maud DeedSheila Vernon using a dual EndoButton technique.  This incision was irrigated, but not closed.  Anterior-inferior lateral, anterior-inferior medial portals were established.  Following arthroscopic findings were present; 1. 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 2. Intact medial meniscus. 3. Healed fracture. 4. Intact lateral compartment, articular cartilage, and meniscus. 5. Intact patellofemoral compartment. 6. No loose bodies in medial and lateral gutter. 7. Torn ACL, intact PCL.  Following arthroscopic evaluation, ACL stump was debrided and notchplasty performed  __________ top position identified.  Microfracture performed on the medial femoral condyle.  Menisci were intact. Following notchplasty and inspection of the rest of the knee, curvilinear incision was made beginning slightly anterior to the fibular head extending proximally around the lateral epicondyle and proximally. Skin and subcutaneous tissue sharply divided.  Fibular head was palpated.  Peroneal nerve was then identified and exposed from its insertion around the fibular neck to proximally under the hamstring. Window developed was between the inferior aspect of the iliotibial band and the biceps tendon.  This was developed both sharply and bluntly. Window developed was dividing the iliotibial band over the popliteal hiatus and lateral collateral ligament attachment site.  Once these 3 windows were developed, the appropriate anatomic landmarks were identified.  At this time, the femoral tunnel was drilled on the ACL under direct visualization.  About a 15 mm back tunnel was created. This was slightly more posterior than typical just in order to avoid the LCL tunnel.  Graft was passed with semi blast placed.  Then tibial tunnel was drilled on the native ACL footprint.  Graft was then secured in full extension using button extenders on both sides because of relatively poor bone quality.  This gave excellent stable fixation on the ACL.  Concurrently, after ACL fixation was achieved, the second hamstring tendon was prepared, it was approximately 5.5 mm.  It was docked into the anterior portion of the popliteal hiatus using 15 mm interference screw from [employer].  Then was taken under the iliotibial band and behind the biceps femoris and it was passed from posterior medial to anterior lateral on the fibular head, which tunnel was drilled with great protection of the common peroneal nerve.  The graft was then passed back up towards the lateral epicondyle.  I passed through the lateral  epicondyle.  It was then fixated with valgus stress at about 10 degrees of internal rotation with bioabsorbable interference screw 7 mm. This gave excellent fixation, excellent stability.  The fibular attachment point was approximately 16-17 mm proximal and inferior to the popliteal attachment site within the horizontal.  This gave a great fixation.  Tourniquet was released at this time.  Bleeding points were controlled with electrocautery.  Thorough irrigation of the knee joint and the incision was performed.  Patient's knee was taken through range of motion, found to have excellent range of motion and stability.  At this time, thorough irrigation was again performed.  The 2 superior windows of __________ were then closed using 0 Vicryl suture followed by interrupted inverted 2-0 Vicryl suture and skin staples.  Bleeding points encountered and was controlled using electrocautery.  The peroneal nerve was very adequately decompressed.  At this time, medial incision for the harvest was irrigated and closed using 0 Vicryl suture, 2-0 Vicryl suture, and 3-0 nylon.  Portals were closed using 3-0 Vicryl and 3-0 nylon.  Solution of Marcaine, morphine, clonidine was injected into the knee.  The patient then had a knee bulky dressing and knee immobilizer placed.  He was transferred to recovery  in stable condition. Velna Hatchet Vernon's assistance was required all times during the case for retraction, mobilization, graft harvest, graft preparation, neurovascular protection.  Her assistance was a medical necessity. Earna Coder was also helpful during the case.  His assistance was also medical necessity.     Burnard Bunting, M.D.    GSD/MEDQ  D:  04/18/2014  T:  04/19/2014  Job:  [id number]

## 2014-04-20 ENCOUNTER — Encounter (HOSPITAL_COMMUNITY): Payer: Self-pay | Admitting: Orthopedic Surgery

## 2014-05-01 NOTE — Discharge Summary (Signed)
Physician Discharge Summary  Patient ID: [name] MRN: [id number] DOB/AGE: [birth year] 28 y.o.  Admit date: 04/18/2014 Discharge date: 10 /7/ 2015 Admission Diagnoses:  Active Problems:   ACL tear   Discharge Diagnoses:  Same  Surgeries: Procedure(s): RECONSTRUCTION ANTERIOR CRUCIATE LIGAMENT (ACL) WITH HAMSTRING GRAFT on 04/18/2014   Consultants:    Discharged Condition: Stable  Hospital Course: Alexander CuriaSinclair Wyman is an 28 y.o. male who was admitted 04/18/2014 with a chief complaint of multiligament knee injury, and found to have a diagnosis of acl tear anf lcl tear.  They were brought to the operating room on 04/18/2014 and underwent the above named procedures.   Discharged home pod 1 in good condition nwb and on coumadin Antibiotics given:  Anti-infectives   Start     Dose/Rate Route Frequency Ordered Stop   04/18/14 1745  clindamycin (CLEOCIN) IVPB 600 mg     600 mg 100 mL/hr over 30 Minutes Intravenous Every 6 hours 04/18/14 1731 04/18/14 2316   04/18/14 0600  clindamycin (CLEOCIN) IVPB 900 mg     900 mg 100 mL/hr over 30 Minutes Intravenous On call to O.R. 04/17/14 1456 04/18/14 1045    .  Recent vital signs:  Filed Vitals:   04/19/14 0523  BP: 93/49  Pulse: 68  Temp: 98.3 F (36.8 C)  Resp: 18    Recent laboratory studies:  Results for orders placed during the hospital encounter of 04/18/14  PROTIME-INR      Result Value Ref Range   Prothrombin Time 15.1  11.6 - 15.2 seconds   INR 1.19  0.00 - 1.49    Discharge Medications:     Medication List    STOP taking these medications       HYDROcodone-acetaminophen 5-325 MG per tablet  Commonly known as:  NORCO/VICODIN      TAKE these medications       methocarbamol 500 MG tablet  Commonly known as:  ROBAXIN  Take 1 tablet (500 mg total) by mouth every 6 (six) hours as needed for muscle spasms.     oxyCODONE 5 MG immediate release tablet  Commonly known as:  Oxy IR/ROXICODONE  Take 1-2  tablets (5-10 mg total) by mouth every 3 (three) hours as needed for breakthrough pain.     warfarin 5 MG tablet  Commonly known as:  COUMADIN  Take 1 tablet (5 mg total) by mouth daily.        Diagnostic Studies: No results found.  Disposition: 01-Home or Self Care      Discharge Instructions   Call MD / Call 911    Complete by:  As directed   If you experience chest pain or shortness of breath, CALL 911 and be transported to the hospital emergency room.  If you develope a fever above 101 F, pus (white drainage) or increased drainage or redness at the wound, or calf pain, call your surgeon's office.     Constipation Prevention    Complete by:  As directed   Drink plenty of fluids.  Prune juice may be helpful.  You may use a stool softener, such as Colace (over the counter) 100 mg twice a day.  Use MiraLax (over the counter) for constipation as needed.     Diet - low sodium heart healthy    Complete by:  As directed      Discharge instructions    Complete by:  As directed   Non weight bearing on right leg Keep incisions dry Elevate leg with  pillows under heel CPM 1 hour three times a day     Increase activity slowly as tolerated    Complete by:  As directed      Non weight bearing    Complete by:  As directed               Signed: DEAN,GREGORY SCOTT 05/01/2014, 8:08 AM

## 2014-07-27 ENCOUNTER — Encounter (HOSPITAL_COMMUNITY): Payer: Self-pay | Admitting: Orthopedic Surgery
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
